# Patient Record
Sex: Female | Born: 1970
Health system: Southern US, Community
[De-identification: ages and names within clinical notes are randomized; demographics above are authoritative.]

## PROBLEM LIST (undated history)

## (undated) DIAGNOSIS — F419 Anxiety disorder, unspecified: Secondary | ICD-10-CM

## (undated) DIAGNOSIS — F32A Depression, unspecified: Secondary | ICD-10-CM

## (undated) DIAGNOSIS — R300 Dysuria: Secondary | ICD-10-CM

## (undated) DIAGNOSIS — I10 Essential (primary) hypertension: Secondary | ICD-10-CM

## (undated) DIAGNOSIS — R319 Hematuria, unspecified: Secondary | ICD-10-CM

## (undated) DIAGNOSIS — Z87442 Personal history of urinary calculi: Secondary | ICD-10-CM

## (undated) DIAGNOSIS — N201 Calculus of ureter: Secondary | ICD-10-CM

## (undated) DIAGNOSIS — G43909 Migraine, unspecified, not intractable, without status migrainosus: Secondary | ICD-10-CM

---

## 1997-10-12 ENCOUNTER — Inpatient Hospital Stay (HOSPITAL_COMMUNITY): Admission: AD | Admit: 1997-10-12 | Discharge: 1997-10-13 | Payer: Self-pay | Admitting: Obstetrics and Gynecology

## 1997-11-09 ENCOUNTER — Other Ambulatory Visit: Admission: RE | Admit: 1997-11-09 | Discharge: 1997-11-09 | Payer: Self-pay | Admitting: Obstetrics and Gynecology

## 2000-07-29 HISTORY — PX: TUBAL LIGATION: SHX77

## 2000-12-22 ENCOUNTER — Encounter: Payer: Self-pay | Admitting: Family Medicine

## 2000-12-22 ENCOUNTER — Encounter: Admission: RE | Admit: 2000-12-22 | Discharge: 2000-12-22 | Payer: Self-pay | Admitting: Family Medicine

## 2000-12-26 ENCOUNTER — Encounter: Payer: Self-pay | Admitting: Neurosurgery

## 2000-12-26 ENCOUNTER — Encounter: Admission: RE | Admit: 2000-12-26 | Discharge: 2000-12-26 | Payer: Self-pay | Admitting: Neurosurgery

## 2001-01-02 ENCOUNTER — Inpatient Hospital Stay (HOSPITAL_COMMUNITY): Admission: RE | Admit: 2001-01-02 | Discharge: 2001-01-03 | Payer: Self-pay | Admitting: Neurosurgery

## 2001-02-17 ENCOUNTER — Encounter: Admission: RE | Admit: 2001-02-17 | Discharge: 2001-05-18 | Payer: Self-pay | Admitting: Neurosurgery

## 2004-09-25 ENCOUNTER — Other Ambulatory Visit: Admission: RE | Admit: 2004-09-25 | Discharge: 2004-09-25 | Payer: Self-pay | Admitting: Obstetrics and Gynecology

## 2005-10-04 ENCOUNTER — Other Ambulatory Visit: Admission: RE | Admit: 2005-10-04 | Discharge: 2005-10-04 | Payer: Self-pay | Admitting: Obstetrics and Gynecology

## 2006-07-29 HISTORY — PX: BREAST SURGERY: SHX581

## 2006-10-21 ENCOUNTER — Other Ambulatory Visit: Admission: RE | Admit: 2006-10-21 | Discharge: 2006-10-21 | Payer: Self-pay | Admitting: Obstetrics and Gynecology

## 2008-10-03 ENCOUNTER — Emergency Department: Payer: Self-pay | Admitting: Emergency Medicine

## 2012-06-22 ENCOUNTER — Emergency Department (HOSPITAL_COMMUNITY)
Admission: EM | Admit: 2012-06-22 | Discharge: 2012-06-22 | Disposition: A | Discharge status: Home / Self Care | Payer: Managed Care, Other (non HMO) | Attending: Emergency Medicine | Admitting: Emergency Medicine

## 2012-06-22 ENCOUNTER — Encounter (HOSPITAL_COMMUNITY): Payer: Self-pay | Admitting: *Deleted

## 2012-06-22 DIAGNOSIS — Z3202 Encounter for pregnancy test, result negative: Secondary | ICD-10-CM | POA: Insufficient documentation

## 2012-06-22 DIAGNOSIS — Z87448 Personal history of other diseases of urinary system: Secondary | ICD-10-CM | POA: Insufficient documentation

## 2012-06-22 DIAGNOSIS — R109 Unspecified abdominal pain: Secondary | ICD-10-CM | POA: Insufficient documentation

## 2012-06-22 LAB — COMPREHENSIVE METABOLIC PANEL
ALT: 10 U/L (ref 0–35)
AST: 13 U/L (ref 0–37)
Albumin: 4.1 g/dL (ref 3.5–5.2)
Alkaline Phosphatase: 59 U/L (ref 39–117)
BUN: 15 mg/dL (ref 6–23)
CO2: 24 mEq/L (ref 19–32)
Calcium: 9.1 mg/dL (ref 8.4–10.5)
Chloride: 104 mEq/L (ref 96–112)
Creatinine, Ser: 0.97 mg/dL (ref 0.50–1.10)
GFR calc Af Amer: 82 mL/min — ABNORMAL LOW (ref >90)
GFR calc non Af Amer: 71 mL/min — ABNORMAL LOW (ref >90)
Glucose, Bld: 101 mg/dL — ABNORMAL HIGH (ref 70–99)
Potassium: 3.7 mEq/L (ref 3.5–5.1)
Sodium: 139 mEq/L (ref 135–145)
Total Bilirubin: 0.2 mg/dL — ABNORMAL LOW (ref 0.3–1.2)
Total Protein: 7.3 g/dL (ref 6.0–8.3)

## 2012-06-22 LAB — CBC WITH DIFFERENTIAL/PLATELET
Basophils Absolute: 0.1 10*3/uL (ref 0.0–0.1)
Basophils Relative: 1 % (ref 0–1)
Eosinophils Absolute: 0.1 10*3/uL (ref 0.0–0.7)
Eosinophils Relative: 1 % (ref 0–5)
HCT: 40.7 % (ref 36.0–46.0)
Hemoglobin: 13.8 g/dL (ref 12.0–15.0)
Lymphocytes Relative: 24 % (ref 12–46)
Lymphs Abs: 2.8 10*3/uL (ref 0.7–4.0)
MCH: 29.3 pg (ref 26.0–34.0)
MCHC: 33.9 g/dL (ref 30.0–36.0)
MCV: 86.4 fL (ref 78.0–100.0)
Monocytes Absolute: 0.9 10*3/uL (ref 0.1–1.0)
Monocytes Relative: 8 % (ref 3–12)
Neutro Abs: 7.7 10*3/uL (ref 1.7–7.7)
Neutrophils Relative %: 66 % (ref 43–77)
Platelets: 286 10*3/uL (ref 150–400)
RBC: 4.71 MIL/uL (ref 3.87–5.11)
RDW: 12.6 % (ref 11.5–15.5)
WBC: 11.6 10*3/uL — ABNORMAL HIGH (ref 4.0–10.5)

## 2012-06-22 LAB — URINE MICROSCOPIC-ADD ON

## 2012-06-22 LAB — URINALYSIS, ROUTINE W REFLEX MICROSCOPIC
Bilirubin Urine: NEGATIVE
Glucose, UA: NEGATIVE mg/dL
Ketones, ur: NEGATIVE mg/dL
Nitrite: NEGATIVE
Protein, ur: NEGATIVE mg/dL
Specific Gravity, Urine: 1.02 (ref 1.005–1.030)
Urobilinogen, UA: 0.2 mg/dL (ref 0.0–1.0)
pH: 7 (ref 5.0–8.0)

## 2012-06-22 LAB — PREGNANCY, URINE: Preg Test, Ur: NEGATIVE

## 2012-06-22 MED ORDER — ONDANSETRON 4 MG PO TBDP
4.0000 mg | ORAL_TABLET | Freq: Once | ORAL | Status: AC
  Administered 2012-06-22: 4 mg via ORAL
  Filled 2012-06-22: qty 1

## 2012-06-22 MED ORDER — OXYCODONE-ACETAMINOPHEN 5-325 MG PO TABS: 1.0000 | ORAL_TABLET | Freq: Four times a day (QID) | ORAL | Status: DC | PRN

## 2012-06-22 MED ORDER — ONDANSETRON HCL 4 MG PO TABS: 4.0000 mg | ORAL_TABLET | Freq: Four times a day (QID) | ORAL | Status: DC

## 2012-06-22 MED ORDER — TAMSULOSIN HCL 0.4 MG PO CAPS: 0.4000 mg | ORAL_CAPSULE | Freq: Every day | ORAL | Status: DC

## 2012-06-22 MED ORDER — HYDROMORPHONE HCL PF 1 MG/ML IJ SOLN
1.0000 mg | Freq: Once | INTRAMUSCULAR | Status: AC
  Administered 2012-06-22: 1 mg via INTRAMUSCULAR
  Filled 2012-06-22: qty 1

## 2012-06-22 NOTE — ED Notes (Signed)
The pt is c/o lower pain this evening.  Hx of kidney stones.  lmp now

## 2012-06-22 NOTE — ED Notes (Signed)
Pt. Reports left flank pain starting tonight. Reports pain has now moved to lower abdominal area. Hx of kidney stones, states "this feels like the last one". Pt. Took 1 vicodin prior to arrival. Pain 6/10

## 2012-06-22 NOTE — ED Provider Notes (Signed)
History     CSN: [id number]  Arrival date & time 06/22/12  2133   First MD Initiated Contact with Patient 06/22/12 2241      Chief Complaint  Patient presents with  . Abdominal Pain    (Consider location/radiation/quality/duration/timing/severity/associated sxs/prior treatment) HPI  41 year old female with history of kidney stones presents complaining of flank pain.  Patient reports acute onset of left flank pain earlier today. Pain is described as a sharp and burning sensation, waxing and waning, radiates down to her groin, nothing makes it better or worse, unable to find a comfortable position, severe intensity, and felt very similar to prior kidney stone that she has in the past. She denies fever, chills, chest pain, shortness of breath, nausea, vomiting, diarrhea, dysuria, or hematuria. She took a Vicodin that she had available which has decreased her pain however her pain has now traveled down to the lower abdomen.  She reports 2 years ago she did develop a stone in the left kidney she was able to pass. Pain is very similar to the pain she has when she had her kidney stone.  She is also on her menstruation.  She denies dysuria, vaginal discharge or rash.      Past Medical History  Diagnosis Date  . Renal disorder     History reviewed. No pertinent past surgical history.  No family history on file.  History  Substance Use Topics  . Smoking status: Never Smoker   . Smokeless tobacco: Not on file  . Alcohol Use: Yes    OB History    Grav Para Term Preterm Abortions TAB SAB Ect Mult Living                  Review of Systems  Constitutional: Negative for fever.  Genitourinary: Positive for flank pain. Negative for dysuria, vaginal discharge, vaginal pain and pelvic pain.  Skin: Negative for rash.  Neurological: Negative for numbness.    Allergies  Review of patient's allergies indicates not on file.  Home Medications  No current outpatient prescriptions on  file.  BP 130/71  Pulse 87  Temp 97.4 F (36.3 C) (Oral)  Resp 16  SpO2 98%  LMP 06/22/2012  Physical Exam  Nursing note and vitals reviewed. Constitutional: She is oriented to person, place, and time. She appears well-developed and well-nourished. She appears distressed (appears uncomfortable, leaning over bed.).  HENT:  Head: Atraumatic.  Eyes: Conjunctivae normal are normal.  Neck: Normal range of motion. Neck supple.  Cardiovascular: Normal rate and regular rhythm.  Exam reveals no gallop and no friction rub.   No murmur heard. Pulmonary/Chest: Effort normal and breath sounds normal. No respiratory distress. She exhibits no tenderness.  Abdominal: Soft. There is tenderness (minimal tenderness to palpation to left lower abd, without guarding or rebound tenderness). There is no rebound and no guarding.  Neurological: She is alert and oriented to person, place, and time.  Skin: Skin is warm. No rash noted.  Psychiatric: She has a normal mood and affect.    ED Course  Procedures (including critical care time)  Labs Reviewed  URINALYSIS, ROUTINE W REFLEX MICROSCOPIC - Abnormal; Notable for the following:    APPearance CLOUDY (*)     Hgb urine dipstick MODERATE (*)     Leukocytes, UA TRACE (*)     All other components within normal limits  CBC WITH DIFFERENTIAL - Abnormal; Notable for the following:    WBC 11.6 (*)     All other components  within normal limits  URINE MICROSCOPIC-ADD ON - Abnormal; Notable for the following:    Squamous Epithelial / LPF FEW (*)     All other components within normal limits  PREGNANCY, URINE  COMPREHENSIVE METABOLIC PANEL  URINE CULTURE   No results found.   No diagnosis found.  Results for orders placed during the hospital encounter of 06/22/12  URINALYSIS, ROUTINE W REFLEX MICROSCOPIC      Component Value Range   Color, Urine YELLOW  YELLOW   APPearance CLOUDY (*) CLEAR   Specific Gravity, Urine 1.020  1.005 - 1.030   pH 7.0  5.0  - 8.0   Glucose, UA NEGATIVE  NEGATIVE mg/dL   Hgb urine dipstick MODERATE (*) NEGATIVE   Bilirubin Urine NEGATIVE  NEGATIVE   Ketones, ur NEGATIVE  NEGATIVE mg/dL   Protein, ur NEGATIVE  NEGATIVE mg/dL   Urobilinogen, UA 0.2  0.0 - 1.0 mg/dL   Nitrite NEGATIVE  NEGATIVE   Leukocytes, UA TRACE (*) NEGATIVE  PREGNANCY, URINE      Component Value Range   Preg Test, Ur NEGATIVE  NEGATIVE  CBC WITH DIFFERENTIAL      Component Value Range   WBC 11.6 (*) 4.0 - 10.5 K/uL   RBC 4.71  3.87 - 5.11 MIL/uL   Hemoglobin 13.8  12.0 - 15.0 g/dL   HCT 16.1  09.6 - 04.5 %   MCV 86.4  78.0 - 100.0 fL   MCH 29.3  26.0 - 34.0 pg   MCHC 33.9  30.0 - 36.0 g/dL   RDW 40.9  81.1 - 91.4 %   Platelets 286  150 - 400 K/uL   Neutrophils Relative 66  43 - 77 %   Neutro Abs 7.7  1.7 - 7.7 K/uL   Lymphocytes Relative 24  12 - 46 %   Lymphs Abs 2.8  0.7 - 4.0 K/uL   Monocytes Relative 8  3 - 12 %   Monocytes Absolute 0.9  0.1 - 1.0 K/uL   Eosinophils Relative 1  0 - 5 %   Eosinophils Absolute 0.1  0.0 - 0.7 K/uL   Basophils Relative 1  0 - 1 %   Basophils Absolute 0.1  0.0 - 0.1 K/uL  COMPREHENSIVE METABOLIC PANEL      Component Value Range   Sodium 139  135 - 145 mEq/L   Potassium 3.7  3.5 - 5.1 mEq/L   Chloride 104  96 - 112 mEq/L   CO2 24  19 - 32 mEq/L   Glucose, Bld 101 (*) 70 - 99 mg/dL   BUN 15  6 - 23 mg/dL   Creatinine, Ser 7.82  0.50 - 1.10 mg/dL   Calcium 9.1  8.4 - 95.6 mg/dL   Total Protein 7.3  6.0 - 8.3 g/dL   Albumin 4.1  3.5 - 5.2 g/dL   AST 13  0 - 37 U/L   ALT 10  0 - 35 U/L   Alkaline Phosphatase 59  39 - 117 U/L   Total Bilirubin 0.2 (*) 0.3 - 1.2 mg/dL   GFR calc non Af Amer 71 (*) >90 mL/min   GFR calc Af Amer 82 (*) >90 mL/min  URINE MICROSCOPIC-ADD ON      Component Value Range   Squamous Epithelial / LPF FEW (*) RARE   WBC, UA 3-6  <3 WBC/hpf   RBC / HPF 21-50  <3 RBC/hpf   Bacteria, UA RARE  RARE   Urine-Other MUCOUS PRESENT  No results found.  1. Left flank  pain   MDM  Pt with hx of kidney stone.  Has pain suggestive of kidney stone.  Pain is acute on onset, L flank, but has travel down to L lower abdomen.  She has blood in her urine however also currently on her menstruation. I believe her pain is likely kidney stone, less likely to be ovarian torsion, UTI, or other acute abdominal pathology.  Non surgical abdomen.    Pt aware that she has retain kidney stone as she recall from prior CT scan 2 years ago in [place] [facility].  No documentation available here.  I do not think she needs CT scan, i offered, pt decline.  Plan to give pain med, and once pt more comfortable will d/c with pain meds, antinausea med and Flomax.  Pt voice understanding and agrees with plan.     11:30 PM Pt felt more comfortable and pain improves with medication.  Able to tolerates PO.  Strict return precaution given.  F/u with urologist given.  Pt voice understanding and agrees with plan.    BP 130/71  Pulse 87  Temp 97.4 F (36.3 C) (Oral)  Resp 16  SpO2 98%  LMP 06/22/2012  I have reviewed nursing notes and vital signs.  I reviewed available ER/hospitalization records thought the EMR        Fayrene Helper, [place] 06/22/12 2331

## 2012-06-22 NOTE — ED Provider Notes (Signed)
Medical screening examination/treatment/procedure(s) were performed by non-physician practitioner and as supervising physician I was immediately available for consultation/collaboration.  Flint Melter, MD 06/22/12 [phone]

## 2012-06-23 ENCOUNTER — Other Ambulatory Visit: Payer: Self-pay | Admitting: Obstetrics and Gynecology

## 2012-06-23 DIAGNOSIS — R928 Other abnormal and inconclusive findings on diagnostic imaging of breast: Secondary | ICD-10-CM

## 2012-06-25 LAB — URINE CULTURE: Colony Count: 25000

## 2012-06-30 ENCOUNTER — Ambulatory Visit
Admission: RE | Admit: 2012-06-30 | Discharge: 2012-06-30 | Disposition: A | Discharge status: Home / Self Care | Payer: Managed Care, Other (non HMO) | Source: Ambulatory Visit | Attending: Obstetrics and Gynecology | Admitting: Obstetrics and Gynecology

## 2012-06-30 DIAGNOSIS — R928 Other abnormal and inconclusive findings on diagnostic imaging of breast: Secondary | ICD-10-CM

## 2012-09-20 ENCOUNTER — Emergency Department (HOSPITAL_COMMUNITY)
Admission: EM | Admit: 2012-09-20 | Discharge: 2012-09-20 | Disposition: A | Discharge status: Home / Self Care | Payer: BC Managed Care – PPO | Attending: Emergency Medicine | Admitting: Emergency Medicine

## 2012-09-20 ENCOUNTER — Encounter (HOSPITAL_COMMUNITY): Payer: Self-pay | Admitting: *Deleted

## 2012-09-20 ENCOUNTER — Emergency Department (HOSPITAL_COMMUNITY): Payer: BC Managed Care – PPO

## 2012-09-20 DIAGNOSIS — N390 Urinary tract infection, site not specified: Secondary | ICD-10-CM | POA: Insufficient documentation

## 2012-09-20 DIAGNOSIS — Z8679 Personal history of other diseases of the circulatory system: Secondary | ICD-10-CM | POA: Insufficient documentation

## 2012-09-20 DIAGNOSIS — Z87442 Personal history of urinary calculi: Secondary | ICD-10-CM | POA: Insufficient documentation

## 2012-09-20 DIAGNOSIS — R6883 Chills (without fever): Secondary | ICD-10-CM | POA: Insufficient documentation

## 2012-09-20 DIAGNOSIS — N209 Urinary calculus, unspecified: Secondary | ICD-10-CM | POA: Insufficient documentation

## 2012-09-20 DIAGNOSIS — R300 Dysuria: Secondary | ICD-10-CM | POA: Insufficient documentation

## 2012-09-20 DIAGNOSIS — R110 Nausea: Secondary | ICD-10-CM | POA: Insufficient documentation

## 2012-09-20 DIAGNOSIS — N1330 Unspecified hydronephrosis: Secondary | ICD-10-CM | POA: Insufficient documentation

## 2012-09-20 HISTORY — DX: Migraine, unspecified, not intractable, without status migrainosus: G43.909

## 2012-09-20 LAB — URINALYSIS, ROUTINE W REFLEX MICROSCOPIC
Glucose, UA: NEGATIVE mg/dL
Specific Gravity, Urine: 1.02 (ref 1.005–1.030)
pH: 6 (ref 5.0–8.0)

## 2012-09-20 LAB — BASIC METABOLIC PANEL
CO2: 18 mEq/L — ABNORMAL LOW (ref 19–32)
Calcium: 8.6 mg/dL (ref 8.4–10.5)
Glucose, Bld: 128 mg/dL — ABNORMAL HIGH (ref 70–99)
Sodium: 133 mEq/L — ABNORMAL LOW (ref 135–145)

## 2012-09-20 LAB — CBC WITH DIFFERENTIAL/PLATELET
Eosinophils Relative: 0 % (ref 0–5)
HCT: 40.8 % (ref 36.0–46.0)
Lymphocytes Relative: 8 % — ABNORMAL LOW (ref 12–46)
Lymphs Abs: 1.3 10*3/uL (ref 0.7–4.0)
MCV: 85.9 fL (ref 78.0–100.0)
Platelets: 271 10*3/uL (ref 150–400)
RBC: 4.75 MIL/uL (ref 3.87–5.11)
WBC: 16.2 10*3/uL — ABNORMAL HIGH (ref 4.0–10.5)

## 2012-09-20 MED ORDER — TAMSULOSIN HCL 0.4 MG PO CAPS
0.4000 mg | ORAL_CAPSULE | Freq: Once | ORAL | Status: AC
  Administered 2012-09-20: 0.4 mg via ORAL
  Filled 2012-09-20: qty 1

## 2012-09-20 MED ORDER — MORPHINE SULFATE 4 MG/ML IJ SOLN
4.0000 mg | Freq: Once | INTRAMUSCULAR | Status: AC
  Administered 2012-09-20: 4 mg via INTRAVENOUS

## 2012-09-20 MED ORDER — SODIUM CHLORIDE 0.9 % IV BOLUS (SEPSIS)
1000.0000 mL | Freq: Once | INTRAVENOUS | Status: AC
  Administered 2012-09-20: 1000 mL via INTRAVENOUS

## 2012-09-20 MED ORDER — HYDROCODONE-ACETAMINOPHEN 5-325 MG PO TABS
2.0000 | ORAL_TABLET | Freq: Once | ORAL | Status: AC
  Administered 2012-09-20: 2 via ORAL
  Filled 2012-09-20: qty 2

## 2012-09-20 MED ORDER — METOCLOPRAMIDE HCL 5 MG/ML IJ SOLN
10.0000 mg | Freq: Once | INTRAMUSCULAR | Status: AC
  Administered 2012-09-20: 10 mg via INTRAVENOUS
  Filled 2012-09-20: qty 2

## 2012-09-20 MED ORDER — CEPHALEXIN 500 MG PO CAPS: 500.0000 mg | ORAL_CAPSULE | Freq: Two times a day (BID) | ORAL | Status: DC

## 2012-09-20 MED ORDER — DEXTROSE 5 % IV SOLN
1.0000 g | Freq: Once | INTRAVENOUS | Status: AC
  Administered 2012-09-20: 1 g via INTRAVENOUS
  Filled 2012-09-20: qty 10

## 2012-09-20 MED ORDER — HYDROCODONE-ACETAMINOPHEN 5-325 MG PO TABS: 1.0000 | ORAL_TABLET | Freq: Four times a day (QID) | ORAL | Status: DC | PRN

## 2012-09-20 MED ORDER — KETOROLAC TROMETHAMINE 30 MG/ML IJ SOLN
30.0000 mg | Freq: Once | INTRAMUSCULAR | Status: AC
  Administered 2012-09-20: 30 mg via INTRAVENOUS
  Filled 2012-09-20: qty 1

## 2012-09-20 MED ORDER — MORPHINE SULFATE 4 MG/ML IJ SOLN
4.0000 mg | INTRAMUSCULAR | Status: DC | PRN
  Filled 2012-09-20: qty 1

## 2012-09-20 NOTE — ED Notes (Signed)
ED Provider in room with pt 

## 2012-09-20 NOTE — ED Notes (Signed)
Pt reports left flank pain that started yesterday. Having pain with urination and nausea. Hx of kidney stones.

## 2012-09-20 NOTE — ED Provider Notes (Addendum)
History     CSN: [id number]  Arrival date & time 09/20/12  1602   First MD Initiated Contact with Patient 09/20/12 1618      Chief Complaint  Patient presents with  . Flank Pain    (Consider location/radiation/quality/duration/timing/severity/associated sxs/prior treatment) HPI Comments: 42 y/o woman comes in with cc of abd pain. Pt has hx of renal stones, on the left side - no surgery required so far. She comes in with right sided abd pain, starting in the flank region and radiating to the groin that started yday - and has worsened over time. She has assoicated nausea, no emesis .She also has some dysuria. No fevers. No new vaginal discharge, bleeding.  Patient is a 42 y.o. female presenting with flank pain. The history is provided by the patient and medical records.  Flank Pain Pertinent negatives include no chest pain, no abdominal pain, no headaches and no shortness of breath.    Past Medical History  Diagnosis Date  . Renal disorder   . Migraines     History reviewed. No pertinent past surgical history.  History reviewed. No pertinent family history.  History  Substance Use Topics  . Smoking status: Never Smoker   . Smokeless tobacco: Not on file  . Alcohol Use: Yes    OB History   Grav Para Term Preterm Abortions TAB SAB Ect Mult Living                  Review of Systems  Constitutional: Positive for chills. Negative for fever and activity change.  HENT: Negative for neck pain.   Respiratory: Negative for shortness of breath.   Cardiovascular: Negative for chest pain.  Gastrointestinal: Positive for nausea. Negative for vomiting and abdominal pain.  Genitourinary: Positive for dysuria and flank pain. Negative for frequency.  Neurological: Negative for headaches.    Allergies  Review of patient's allergies indicates no known allergies.  Home Medications   Current Outpatient Rx  Name  Route  Sig  Dispense  Refill  . ibuprofen (ADVIL) 200 MG  tablet   Oral   Take 600 mg by mouth every 6 (six) hours as needed for pain.         Marland Kitchen oxyCODONE-acetaminophen (PERCOCET/ROXICET) 5-325 MG per tablet   Oral   Take 1-2 tablets by mouth every 6 (six) hours as needed for pain.   20 tablet   0   . Tamsulosin HCl (FLOMAX) 0.4 MG CAPS   Oral   Take 1 capsule (0.4 mg total) by mouth daily.   10 capsule   0     BP 128/67  Pulse 95  Temp(Src) 97.5 F (36.4 C) (Oral)  Resp 16  SpO2 99%  Physical Exam  Nursing note and vitals reviewed. Constitutional: She is oriented to person, place, and time. She appears well-developed and well-nourished.  HENT:  Head: Normocephalic and atraumatic.  Eyes: EOM are normal. Pupils are equal, round, and reactive to light.  Neck: Neck supple.  Cardiovascular: Normal rate, regular rhythm and normal heart sounds.   No murmur heard. Pulmonary/Chest: Effort normal. No respiratory distress.  Abdominal: Soft. She exhibits no distension. There is tenderness. There is no rebound and no guarding.  LLQ tenderness with flank tenderness.  Neurological: She is alert and oriented to person, place, and time.  Skin: Skin is warm and dry.    ED Course  Procedures (including critical care time)  Labs Reviewed  CBC WITH DIFFERENTIAL - Abnormal; Notable for the following:  WBC 16.2 (*)    Neutrophils Relative 85 (*)    Neutro Abs 13.7 (*)    Lymphocytes Relative 8 (*)    Monocytes Absolute 1.2 (*)    All other components within normal limits  BASIC METABOLIC PANEL - Abnormal; Notable for the following:    Sodium 133 (*)    CO2 18 (*)    Glucose, Bld 128 (*)    GFR calc non Af Amer 62 (*)    GFR calc Af Amer 72 (*)    All other components within normal limits  URINALYSIS, ROUTINE W REFLEX MICROSCOPIC - Abnormal; Notable for the following:    APPearance TURBID (*)    Hgb urine dipstick LARGE (*)    Leukocytes, UA LARGE (*)    All other components within normal limits  URINE MICROSCOPIC-ADD ON -  Abnormal; Notable for the following:    Squamous Epithelial / LPF MANY (*)    Bacteria, UA MANY (*)    All other components within normal limits  URINE CULTURE   No results found.   No diagnosis found.    MDM  Pt comes in with cc of abd pain.  DDx includes:  Colitis Intra abdominal abscess Thrombosis Mesenteric ischemia Diverticulitis Hernia Nephrolithiasis Pyelonephritis UTI/Cystitis Ovarian cyst TOA PID  Pt comes in with cc of flank pain. Pt has hx of multiple stones in the past, none have required surgery. The current sx are more severe, and she has associated dysuria - so we will get CT pelvis to get the morphology of the stone.    Derwood Kaplan, MD 09/20/12 1733  8:58 PM Urology consulted for the large stone. They wanted to see patient in the AM at the clinic if her symptoms are under control pain wise. Pt was give 2 norco tabs, and she is feeling better. Will call Urology again, and establish an appt. She is asked to be npo after MN. No emesis.  Derwood Kaplan, MD 09/20/12 2059

## 2012-09-21 ENCOUNTER — Other Ambulatory Visit: Payer: Self-pay | Admitting: Urology

## 2012-09-21 ENCOUNTER — Encounter (HOSPITAL_COMMUNITY): Payer: Self-pay | Admitting: *Deleted

## 2012-09-21 NOTE — Pre-Procedure Instructions (Signed)
Asked to bring blue folder the day of the procedure,insurance card,I.D. driver's license,wear comfortable clothing and have a driver for the day. Asked not to take Advil,Motrin,Ibuprofen,Aleve or any NSAIDS, Aspirin, or Toradol for 72 hours prior to procedure,  No vitamins or herbal medications 7 days prior to procedure. Instructed to take laxative per doctor's office instructions and eat a light dinner the evening before procedure.   To arrive at 0530  for lithotripsy procedure. 

## 2012-09-22 ENCOUNTER — Other Ambulatory Visit: Payer: Self-pay | Admitting: Urology

## 2012-09-22 LAB — URINE CULTURE: Colony Count: 3000

## 2012-09-23 ENCOUNTER — Encounter (HOSPITAL_BASED_OUTPATIENT_CLINIC_OR_DEPARTMENT_OTHER): Payer: Self-pay | Admitting: *Deleted

## 2012-09-23 NOTE — Progress Notes (Signed)
NPO AFTER MN. ARRIVES AT 0830. NEEDS KUB AND HG. MAY TAKE RX PAIN MED IF NEEDED W/ SIPS OF WATER.

## 2012-09-24 DIAGNOSIS — N201 Calculus of ureter: Secondary | ICD-10-CM | POA: Diagnosis present

## 2012-09-24 NOTE — H&P (Signed)
Reason For Visit  Mrs. Forsee is a 42 year old female seen in followup from the emergency room with a left UPJ stone   History of Present Illness     Nephrolithiasis: She passed a 3 mm stone on the right side in 3/10. A CT scan at that time revealed a 2 mm stone in the lower pole of her left kidney. CT scan 09/20/12 - punctate left lower pole renal calculi with no right renal calculi. 8 x 11 mm left UPJ stone, Hounsfield units 1400 with some Hydro.  Interval history: She was seen in the emergency room on 09/20/12 with progressively worsening right-sided abdominal pain with radiation into the flank associated with nausea but no vomiting. Her urinalysis at that time had 7-10 RBCs, 20-50 WBCs with many bacteria but there was also many squamous cells indicating contamination. A urine culture was performed and is pending at this time. She is not having any fever, chills or other constitutional symptoms. She has been having intermittent pain. She passed a stone about 2 months ago and had some tamsulosin on hand and started taking that as well. That has run out now. The pain medication that she was prescribed is not controlling her pain adequately. Her pain is not modified by positional change. It has been associated with hematuria. She does not have dysuria.   Past Medical History Problems  1. History of  Arthritis V13.4 2. History of  Depression 311 3. History of  Migraine Headache 346.90 4. History of  Ureteral Stone Right 592.1  Surgical History Problems  1. History of  Tubal Ligation V25.2  Current Meds 1. Frova 2.5 MG Oral Tablet; Therapy: (Recorded:11Mar2010) to 2. TiZANidine HCl TABS; Therapy: (Recorded:11Mar2010) to 3. Topiramate TABS; Therapy: (Recorded:11Mar2010) to  Allergies Medication  1. No Known Drug Allergies  Family History Problems  1. Family history of  Family Health Status Number Of Children 1 son; 1 daughter  Social History Problems    Alcohol Use rare   Caffeine  Use daily   Marital History - Currently Married   Occupation: network admini.   Tobacco Use V15.82 quit smoking 2 yrs ago  Review of Systems Genitourinary, constitutional, skin, eye, otolaryngeal, hematologic/lymphatic, cardiovascular, pulmonary, endocrine, musculoskeletal, gastrointestinal, neurological and psychiatric system(s) were reviewed and pertinent findings if present are noted.  Genitourinary: hematuria.  Neurological: headache.    Vitals Vital Signs  BMI Calculated: 28.71 BSA Calculated: 1.72 Height: 5 ft 2 in Weight: 156 lb  Blood Pressure: 109 / 69 Temperature: 97.2 F Heart Rate: 96  Physical Exam Constitutional: Well nourished and well developed . No acute distress.  ENT:. The ears and nose are normal in appearance.  Neck: The appearance of the neck is normal and no neck mass is present.  Pulmonary: No respiratory distress and normal respiratory rhythm and effort.  Cardiovascular: Heart rate and rhythm are normal . No peripheral edema.  Abdomen: The abdomen is soft and nontender. No masses are palpated. No CVA tenderness. No hernias are palpable. No hepatosplenomegaly noted.  Lymphatics: The femoral and inguinal nodes are not enlarged or tender.  Skin: Normal skin turgor, no visible rash and no visible skin lesions.  Neuro/Psych:. Mood and affect are appropriate.    Results/Data Urine  COLOR YELLOW   APPEARANCE CLOUDY   SPECIFIC GRAVITY 1.030   pH 5.5   GLUCOSE NEG mg/dL  BILIRUBIN NEG   KETONE TRACE mg/dL  BLOOD SMALL   PROTEIN TRACE mg/dL  UROBILINOGEN 0.2 mg/dL  NITRITE NEG   LEUKOCYTE  ESTERASE SMALL   SQUAMOUS EPITHELIAL/HPF MANY   WBC TNTC WBC/hpf  RBC 3-6 RBC/hpf  BACTERIA MODERATE   CRYSTALS NONE SEEN   CASTS NONE SEEN    Old records or history reviewed: ER notes as above.  The following images/tracing/specimen were independently visualized:  CT scan as above.  The following clinical lab reports were reviewed:  UA as above.  The  following radiology reports were reviewed: CT scan.    Assessment Assessed  1. Proximal Ureteral Stone On The Left 592.1 2. Hydronephrosis On The Left 591 3. Nephrolithiasis Of The Left Kidney 592.0         I have discussed with the patient the fact that she has a fairly large stone located proximally in the ureter. And although it is 11 mm long it is only 4.8 mm wide. We discussed the options which would include medical expulsive therapy using an alpha-blocker, ureteroscopy and lithotripsy. I don't think a percutaneous nephrolithotomy is indicated due to the stone size. It's fairly large although it could be treated ureteroscopically this would require a postprocedural stent. We also discussed lithotripsy as another option although the stone is fairly hard based on its Hounsfield units and therefore she is at risk for incomplete fragmentation of the stone. This may necessitate further procedures if the stone does not fragment completely and passed spontaneously. We discussed the probability of success as well as the risks and palpitations. She understands and has elected to proceed with lithotripsy however because the lithotripter was not available soon enough she elected to proceed with ureteroscopy.  Plan    1. Obtain urine culture results. 2. She will be scheduled for Ureteroscopy and laser lithotripsy of her left UPJ stone. 3. I will maintain her on medical expulsive therapy and give her a prescription for pain medication.

## 2012-09-25 ENCOUNTER — Ambulatory Visit (HOSPITAL_COMMUNITY): Payer: BC Managed Care – PPO

## 2012-09-25 ENCOUNTER — Ambulatory Visit (HOSPITAL_BASED_OUTPATIENT_CLINIC_OR_DEPARTMENT_OTHER)
Admission: RE | Admit: 2012-09-25 | Discharge: 2012-09-25 | Disposition: A | Discharge status: Home / Self Care | Payer: BC Managed Care – PPO | Source: Ambulatory Visit | Attending: Urology | Admitting: Urology

## 2012-09-25 ENCOUNTER — Ambulatory Visit (HOSPITAL_BASED_OUTPATIENT_CLINIC_OR_DEPARTMENT_OTHER): Payer: BC Managed Care – PPO | Admitting: Anesthesiology

## 2012-09-25 ENCOUNTER — Encounter (HOSPITAL_BASED_OUTPATIENT_CLINIC_OR_DEPARTMENT_OTHER): Payer: Self-pay | Admitting: Anesthesiology

## 2012-09-25 ENCOUNTER — Encounter (HOSPITAL_BASED_OUTPATIENT_CLINIC_OR_DEPARTMENT_OTHER)
Admission: RE | Disposition: A | Discharge status: Home / Self Care | Payer: Self-pay | Source: Ambulatory Visit | Attending: Urology

## 2012-09-25 ENCOUNTER — Encounter (HOSPITAL_BASED_OUTPATIENT_CLINIC_OR_DEPARTMENT_OTHER): Payer: Self-pay | Admitting: *Deleted

## 2012-09-25 DIAGNOSIS — N201 Calculus of ureter: Secondary | ICD-10-CM | POA: Diagnosis present

## 2012-09-25 DIAGNOSIS — N1330 Unspecified hydronephrosis: Secondary | ICD-10-CM | POA: Insufficient documentation

## 2012-09-25 DIAGNOSIS — Z79899 Other long term (current) drug therapy: Secondary | ICD-10-CM | POA: Insufficient documentation

## 2012-09-25 DIAGNOSIS — G43909 Migraine, unspecified, not intractable, without status migrainosus: Secondary | ICD-10-CM | POA: Insufficient documentation

## 2012-09-25 HISTORY — PX: STONE EXTRACTION WITH BASKET: SHX5318

## 2012-09-25 HISTORY — PX: CYSTOSCOPY WITH RETROGRADE PYELOGRAM, URETEROSCOPY AND STENT PLACEMENT: SHX5789

## 2012-09-25 HISTORY — DX: Personal history of urinary calculi: Z87.442

## 2012-09-25 HISTORY — PX: HOLMIUM LASER APPLICATION: SHX5852

## 2012-09-25 HISTORY — DX: Calculus of ureter: N20.1

## 2012-09-25 HISTORY — DX: Dysuria: R30.0

## 2012-09-25 HISTORY — DX: Hematuria, unspecified: R31.9

## 2012-09-25 LAB — POCT HEMOGLOBIN-HEMACUE: Hemoglobin: 13.4 g/dL (ref 12.0–15.0)

## 2012-09-25 SURGERY — HOLMIUM LASER APPLICATION
Anesthesia: General | Site: Ureter | Laterality: Left | Wound class: Clean Contaminated

## 2012-09-25 MED ORDER — PROPOFOL 10 MG/ML IV BOLUS
INTRAVENOUS | Status: DC | PRN
  Administered 2012-09-25: 180 mg via INTRAVENOUS

## 2012-09-25 MED ORDER — FESOTERODINE FUMARATE ER 4 MG PO TB24
4.0000 mg | ORAL_TABLET | Freq: Every day | ORAL | Status: DC
  Administered 2012-09-25: 4 mg via ORAL
  Filled 2012-09-25: qty 1

## 2012-09-25 MED ORDER — PROMETHAZINE HCL 25 MG/ML IJ SOLN
6.2500 mg | INTRAMUSCULAR | Status: DC | PRN
  Filled 2012-09-25: qty 1

## 2012-09-25 MED ORDER — FENTANYL CITRATE 0.05 MG/ML IJ SOLN
INTRAMUSCULAR | Status: DC | PRN
  Administered 2012-09-25 (×2): 50 ug via INTRAVENOUS

## 2012-09-25 MED ORDER — DEXAMETHASONE SODIUM PHOSPHATE 4 MG/ML IJ SOLN
INTRAMUSCULAR | Status: DC | PRN
  Administered 2012-09-25: 8 mg via INTRAVENOUS

## 2012-09-25 MED ORDER — TOLTERODINE TARTRATE ER 4 MG PO CP24: 4.0000 mg | ORAL_CAPSULE | Freq: Every day | ORAL | Status: DC

## 2012-09-25 MED ORDER — FENTANYL CITRATE 0.05 MG/ML IJ SOLN
25.0000 ug | INTRAMUSCULAR | Status: DC | PRN
  Filled 2012-09-25: qty 1

## 2012-09-25 MED ORDER — MIDAZOLAM HCL 5 MG/5ML IJ SOLN
INTRAMUSCULAR | Status: DC | PRN
  Administered 2012-09-25: 2 mg via INTRAVENOUS

## 2012-09-25 MED ORDER — PHENAZOPYRIDINE HCL 200 MG PO TABS: 200.0000 mg | ORAL_TABLET | Freq: Three times a day (TID) | ORAL | Status: DC | PRN

## 2012-09-25 MED ORDER — LACTATED RINGERS IV SOLN
INTRAVENOUS | Status: DC
  Administered 2012-09-25: 11:00:00 via INTRAVENOUS
  Administered 2012-09-25: 100 mL/h via INTRAVENOUS
  Filled 2012-09-25: qty 1000

## 2012-09-25 MED ORDER — SODIUM CHLORIDE 0.9 % IR SOLN
Status: DC | PRN
  Administered 2012-09-25: 6000 mL via INTRAVESICAL

## 2012-09-25 MED ORDER — LACTATED RINGERS IV SOLN
INTRAVENOUS | Status: DC
  Filled 2012-09-25: qty 1000

## 2012-09-25 MED ORDER — MEPERIDINE HCL 25 MG/ML IJ SOLN
6.2500 mg | INTRAMUSCULAR | Status: DC | PRN
  Filled 2012-09-25: qty 1

## 2012-09-25 MED ORDER — PHENAZOPYRIDINE HCL 200 MG PO TABS
200.0000 mg | ORAL_TABLET | Freq: Once | ORAL | Status: AC
  Administered 2012-09-25: 200 mg via ORAL
  Filled 2012-09-25: qty 1

## 2012-09-25 MED ORDER — ONDANSETRON HCL 4 MG/2ML IJ SOLN
INTRAMUSCULAR | Status: DC | PRN
  Administered 2012-09-25: 4 mg via INTRAVENOUS

## 2012-09-25 MED ORDER — LIDOCAINE HCL (CARDIAC) 20 MG/ML IV SOLN
INTRAVENOUS | Status: DC | PRN
  Administered 2012-09-25: 60 mg via INTRAVENOUS

## 2012-09-25 MED ORDER — IOHEXOL 350 MG/ML SOLN
INTRAVENOUS | Status: DC | PRN
  Administered 2012-09-25: 50 mL

## 2012-09-25 MED ORDER — CIPROFLOXACIN IN D5W 200 MG/100ML IV SOLN
200.0000 mg | INTRAVENOUS | Status: AC
  Administered 2012-09-25: 200 mg via INTRAVENOUS
  Filled 2012-09-25: qty 100

## 2012-09-25 SURGICAL SUPPLY — 40 items
ADAPTER CATH URET PLST 4-6FR (CATHETERS) ×2 IMPLANT
ADPR CATH URET STRL DISP 4-6FR (CATHETERS) ×2
BAG DRAIN URO-CYSTO SKYTR STRL (DRAIN) ×3 IMPLANT
BAG DRN UROCATH (DRAIN) ×2
BASKET LASER NITINOL 1.9FR (BASKET) IMPLANT
BASKET STNLS GEMINI 4WIRE 3FR (BASKET) IMPLANT
BASKET ZERO TIP NITINOL 2.4FR (BASKET) ×2 IMPLANT
BRUSH URET BIOPSY 3F (UROLOGICAL SUPPLIES) IMPLANT
BSKT STON RTRVL 120 1.9FR (BASKET)
BSKT STON RTRVL GEM 120X11 3FR (BASKET)
BSKT STON RTRVL ZERO TP 2.4FR (BASKET) ×2
CANISTER SUCT LVC 12 LTR MEDI- (MISCELLANEOUS) ×2 IMPLANT
CATH INTERMIT  6FR 70CM (CATHETERS) IMPLANT
CATH URET 5FR 28IN CONE TIP (BALLOONS)
CATH URET 5FR 70CM CONE TIP (BALLOONS) IMPLANT
CLOTH BEACON ORANGE TIMEOUT ST (SAFETY) ×3 IMPLANT
DRAPE CAMERA CLOSED 9X96 (DRAPES) ×3 IMPLANT
ELECT REM PT RETURN 9FT ADLT (ELECTROSURGICAL)
ELECTRODE REM PT RTRN 9FT ADLT (ELECTROSURGICAL) IMPLANT
GLOVE BIO SURGEON STRL SZ 6.5 (GLOVE) ×2 IMPLANT
GLOVE BIO SURGEON STRL SZ8 (GLOVE) ×3 IMPLANT
GLOVE INDICATOR 7.0 STRL GRN (GLOVE) ×2 IMPLANT
GOWN PREVENTION PLUS LG XLONG (DISPOSABLE) ×3 IMPLANT
GOWN STRL REIN XL XLG (GOWN DISPOSABLE) ×5 IMPLANT
GUIDEWIRE 0.038 PTFE COATED (WIRE) ×1 IMPLANT
GUIDEWIRE ANG ZIPWIRE 038X150 (WIRE) IMPLANT
GUIDEWIRE STR DUAL SENSOR (WIRE) ×3 IMPLANT
IV NS IRRIG 3000ML ARTHROMATIC (IV SOLUTION) ×6 IMPLANT
KIT BALLIN UROMAX 15FX10 (LABEL) IMPLANT
KIT BALLN UROMAX 15FX4 (MISCELLANEOUS) IMPLANT
KIT BALLN UROMAX 26 75X4 (MISCELLANEOUS)
LASER FIBER DISP (UROLOGICAL SUPPLIES) ×2 IMPLANT
NS IRRIG 500ML POUR BTL (IV SOLUTION) IMPLANT
PACK CYSTOSCOPY (CUSTOM PROCEDURE TRAY) ×3 IMPLANT
SET HIGH PRES BAL DIL (LABEL)
SHEATH ACCESS URETERAL 38CM (SHEATH) ×2 IMPLANT
SHEATH ACCESS URETERAL 54CM (SHEATH) IMPLANT
SHEATH URET ACCESS 12FR/35CM (UROLOGICAL SUPPLIES) IMPLANT
SHEATH URET ACCESS 12FR/55CM (UROLOGICAL SUPPLIES) IMPLANT
WATER STERILE IRR 3000ML UROMA (IV SOLUTION) IMPLANT

## 2012-09-25 NOTE — Interval H&P Note (Signed)
History and Physical Interval Note:  09/25/2012 9:25 AM  Judith Simmons  has presented today for surgery, with the diagnosis of LEFT UPJ STONE  The various methods of treatment have been discussed with the patient and family. After consideration of risks, benefits and other options for treatment, the patient has consented to  Procedure(s): LEFT URETEROSCOPY, LASER LITHO AND STENT (Left) LEFT URETEROSCOPY, LASER LITHO AND STENT (Left) HOLMIUM LASER APPLICATION (Left) as a surgical intervention .  The patient's history has been reviewed, patient examined, no change in status, stable for surgery.  I have reviewed the patient's chart and labs.  Questions were answered to the patient's satisfaction.     Garnett Farm

## 2012-09-25 NOTE — Anesthesia Procedure Notes (Addendum)
Procedure Name: LMA Insertion Date/Time: 09/25/2012 10:22 AM Performed by: Renella Cunas D Pre-anesthesia Checklist: Patient identified, Emergency Drugs available, Suction available and Patient being monitored Patient Re-evaluated:Patient Re-evaluated prior to inductionOxygen Delivery Method: Circle System Utilized Preoxygenation: Pre-oxygenation with 100% oxygen Intubation Type: IV induction Ventilation: Mask ventilation without difficulty LMA: LMA inserted LMA Size: 4.0 Number of attempts: 1 Airway Equipment and Method: bite block Placement Confirmation: positive ETCO2 Tube secured with: Tape Dental Injury: Teeth and Oropharynx as per pre-operative assessment

## 2012-09-25 NOTE — Transfer of Care (Signed)
Immediate Anesthesia Transfer of Care Note  Patient: Judith Simmons  Procedure(s) Performed: Procedure(s) (LRB): LEFT URETEROSCOPY, LASER LITHO AND STENT (Left) HOLMIUM LASER APPLICATION (Left)  Patient Location: PACU  Anesthesia Type: General  Level of Consciousness: awake, oriented, sedated and patient cooperative  Airway & Oxygen Therapy: Patient Spontanous Breathing and Patient connected to face mask oxygen  Post-op Assessment: Report given to PACU RN and Post -op Vital signs reviewed and stable  Post vital signs: Reviewed and stable  Complications: No apparent anesthesia complications

## 2012-09-25 NOTE — Anesthesia Postprocedure Evaluation (Signed)
  Anesthesia Post-op Note  Patient: Judith Simmons  Procedure(s) Performed: Procedure(s) (LRB): LEFT URETEROSCOPY, LASER LITHO AND STENT (Left) HOLMIUM LASER APPLICATION (Left)  Patient Location: PACU  Anesthesia Type: General  Level of Consciousness: awake and alert   Airway and Oxygen Therapy: Patient Spontanous Breathing  Post-op Pain: mild  Post-op Assessment: Post-op Vital signs reviewed, Patient's Cardiovascular Status Stable, Respiratory Function Stable, Patent Airway and No signs of Nausea or vomiting  Last Vitals:  Filed Vitals:   09/25/12 1120  BP: 145/81  Pulse: 84  Temp: 36.8 C  Resp: 14    Post-op Vital Signs: stable   Complications: No apparent anesthesia complications

## 2012-09-25 NOTE — Op Note (Signed)
PATIENT:  Judith Simmons  PRE-OPERATIVE DIAGNOSIS:  left Ureteral calculus  POST-OPERATIVE DIAGNOSIS: Same  PROCEDURE:  1. cystoscopy with left retrograde pyelogram including interpretation. 2. Left ureteroscopy and laser lithotripsy. 3. Left stone extraction. 4. Left double-J stent placement  SURGEON: Garnett Farm, MD  INDICATION: Mrs. Curnutt is a 42 year old female who developed left renal colic and was found to have a 6 x 11 mm stone located just below the ureteropelvic junction on the left-hand side. She was placed on medical expulsive therapy but continued to have pain. We discussed initially performing lithotripsy but she elected to proceed with ureteroscopy as she was able to have this performed at an earlier date. The procedure, its risks and complications as well as probability of success were discussed. She understands and has elected to proceed.  ANESTHESIA:  General  EBL:  Minimal  DRAINS: 6 French, 24 cm double-J stent (with string)  SPECIMEN:  Stone  DISPOSITION OF SPECIMEN:  Given to patient  DESCRIPTION OF PROCEDURE: The patient was taken to the major OR and placed on the table. General anesthesia was administered and then the patient was moved to the dorsal lithotomy position. The genitalia was sterilely prepped and draped. An official timeout was performed.  Initially the 22 French cystoscope with 12 lens was passed under direct vision. The bladder was then entered and fully inspected. It was noted be free of any tumors stones or inflammatory lesions. Ureteral orifices were of normal configuration and position. A 6 French open-ended ureteral catheter was then passed through the cystoscope into the ureteral orifice in order to perform a left retrograde pyelogram.  A retrograde pyelogram was performed by injecting full-strength contrast up the left ureter under direct fluoroscopic control. It revealed a filling defect in the proximal ureter consistent with the stone  seen on the preoperative KUB. The remainder of the ureter was noted to be normal as was the intrarenal collecting system. I then passed a 0.038 inch floppy-tipped guidewire through the open ended catheter and into the area of the renal pelvis and this was left in place. The inner portion of a ureteral access sheath was then passed over the guidewire to gently dilate the intramural ureter. Then the ureteral access sheath with inner cannula was passed over the guidewire and in doing so I noted, under fluoroscopy, the stone had fallen back into the kidney. A ureteral access sheath was left in place and the inner portion removed as well as the guidewire. I then proceeded with ureteroscopy.  A 6 French rigid ureteroscope was then passed into the ureteral access sheath in the left ureter. The stone was identified within the middle pole calyx and I felt it was too large to extract and therefore elected to proceed with laser lithotripsy. The 200  holmium laser fiber was used to fragment the stone. I then used the nitinol basket to extract all of the stone fragments and reinspection of the intrarenal collecting system ureteroscopically revealed no further stone fragments and no injury to the ureter. I passed a guidewire back through the ureteral access sheath and removed the sheath. I then backloaded the cystoscope over the guidewire and passed the stent over the guidewire into the area of the renal pelvis. As the guidewire was removed good curl was noted in the renal pelvis. The bladder was drained and the cystoscope was then removed. The patient tolerated the procedure well no intraoperative complications.  PLAN OF CARE: Discharge to home after PACU  PATIENT DISPOSITION:  PACU - hemodynamically stable.

## 2012-09-25 NOTE — Anesthesia Preprocedure Evaluation (Signed)
Anesthesia Evaluation  Patient identified by MRN, date of birth, ID band Patient awake    Reviewed: Allergy & Precautions, H&P , NPO status , Patient's Chart, lab work & pertinent test results  Airway Mallampati: II TM Distance: >3 FB Neck ROM: Full    Dental no notable dental hx.    Pulmonary neg pulmonary ROS,  breath sounds clear to auscultation  Pulmonary exam normal       Cardiovascular negative cardio ROS  Rhythm:Regular Rate:Normal     Neuro/Psych negative neurological ROS  negative psych ROS   GI/Hepatic negative GI ROS, Neg liver ROS,   Endo/Other  negative endocrine ROS  Renal/GU negative Renal ROS  negative genitourinary   Musculoskeletal negative musculoskeletal ROS (+)   Abdominal   Peds negative pediatric ROS (+)  Hematology negative hematology ROS (+)   Anesthesia Other Findings   Reproductive/Obstetrics negative OB ROS                           Anesthesia Physical Anesthesia Plan  ASA: II  Anesthesia Plan: General   Post-op Pain Management:    Induction: Intravenous  Airway Management Planned: LMA  Additional Equipment:   Intra-op Plan:   Post-operative Plan: Extubation in OR  Informed Consent: I have reviewed the patients History and Physical, chart, labs and discussed the procedure including the risks, benefits and alternatives for the proposed anesthesia with the patient or authorized representative who has indicated his/her understanding and acceptance.   Dental advisory given  Plan Discussed with: CRNA  Anesthesia Plan Comments:         Anesthesia Quick Evaluation  

## 2012-09-28 ENCOUNTER — Ambulatory Visit (HOSPITAL_COMMUNITY): Admission: RE | Admit: 2012-09-28 | Payer: BC Managed Care – PPO | Source: Ambulatory Visit | Admitting: Urology

## 2012-09-28 ENCOUNTER — Encounter (HOSPITAL_BASED_OUTPATIENT_CLINIC_OR_DEPARTMENT_OTHER): Payer: Self-pay | Admitting: Urology

## 2012-09-28 SURGERY — LITHOTRIPSY, ESWL
Anesthesia: LOCAL | Laterality: Left

## 2013-06-08 ENCOUNTER — Emergency Department (HOSPITAL_COMMUNITY)
Admission: EM | Admit: 2013-06-08 | Discharge: 2013-06-08 | Disposition: A | Discharge status: Home / Self Care | Payer: BC Managed Care – PPO | Source: Home / Self Care | Attending: Emergency Medicine | Admitting: Emergency Medicine

## 2013-06-08 ENCOUNTER — Encounter (HOSPITAL_COMMUNITY): Payer: Self-pay | Admitting: Emergency Medicine

## 2013-06-08 DIAGNOSIS — J019 Acute sinusitis, unspecified: Secondary | ICD-10-CM

## 2013-06-08 DIAGNOSIS — J111 Influenza due to unidentified influenza virus with other respiratory manifestations: Secondary | ICD-10-CM

## 2013-06-08 LAB — POCT RAPID STREP A: Streptococcus, Group A Screen (Direct): NEGATIVE

## 2013-06-08 MED ORDER — OSELTAMIVIR PHOSPHATE 75 MG PO CAPS: 75.0000 mg | ORAL_CAPSULE | Freq: Two times a day (BID) | ORAL | Status: DC

## 2013-06-08 MED ORDER — AMOXICILLIN-POT CLAVULANATE 875-125 MG PO TABS: 1.0000 | ORAL_TABLET | Freq: Two times a day (BID) | ORAL | Status: DC

## 2013-06-08 MED ORDER — BENZONATATE 200 MG PO CAPS: 200.0000 mg | ORAL_CAPSULE | Freq: Three times a day (TID) | ORAL | Status: DC | PRN

## 2013-06-08 NOTE — ED Provider Notes (Signed)
Chief Complaint:   Chief Complaint  Patient presents with  . URI    History of Present Illness:   Judith Simmons is a 42 year old female who has had a three-day history of nasal congestion with yellow-green drainage, headache, fever of 100.3, sweats, difficulty sleeping at night, has felt hot and cold, had sore throat, hoarseness, cough productive yellow-green sputum, nausea. She denies any muscle aches, earache, wheezing, chest pain, or GI symptoms. She denies any sick exposures. Patient states she never gets flu shots.  Review of Systems:  Other than noted above, the patient denies any of the following symptoms: Systemic:  No fevers, chills, sweats, weight loss or gain, fatigue, or tiredness. Eye:  No redness or discharge. ENT:  No ear pain, drainage, headache, nasal congestion, drainage, sinus pressure, difficulty swallowing, or sore throat. Neck:  No neck pain or swollen glands. Lungs:  No cough, sputum production, hemoptysis, wheezing, chest tightness, shortness of breath or chest pain. GI:  No abdominal pain, nausea, vomiting or diarrhea.  PMFSH:  Past medical history, family history, social history, meds, and allergies were reviewed. Takes Topamax, Maxalt, and Loestrin. She has migraine headaches.  Physical Exam:   Vital signs:  BP 128/86  Pulse 102  Temp(Src) 98.6 F (37 C) (Oral)  Resp 16  SpO2 100% General:  Alert and oriented.  In no distress.  Skin warm and dry. Eye:  No conjunctival injection or drainage. Lids were normal. ENT:  TMs and canals were normal, without erythema or inflammation.  Nasal mucosa was clear and uncongested, without drainage.  Mucous membranes were moist.  Pharynx was erythematous with no exudate or drainage.  There were no oral ulcerations or lesions. Neck:  Supple, no adenopathy, tenderness or mass. Lungs:  No respiratory distress.  Lungs were clear to auscultation, without wheezes, rales or rhonchi.  Breath sounds were clear and equal bilaterally.   Heart:  Regular rhythm, without gallops, murmers or rubs. Skin:  Clear, warm, and dry, without rash or lesions.  Labs:   Results for orders placed during the hospital encounter of 06/08/13  POCT RAPID STREP A ([facility] CARE ONLY)      Result Value Range   Streptococcus, Group A Screen (Direct) NEGATIVE  NEGATIVE    Assessment:  The primary encounter diagnosis was Influenza-like illness. A diagnosis of Acute sinusitis was also pertinent to this visit.  Plan:   1.  Meds:  The following meds were prescribed:   Discharge Medication List as of 06/08/2013  9:37 AM    START taking these medications   Details  amoxicillin-clavulanate (AUGMENTIN) 875-125 MG per tablet Take 1 tablet by mouth 2 (two) times daily., Starting 06/08/2013, Until Discontinued, Normal    benzonatate (TESSALON) 200 MG capsule Take 1 capsule (200 mg total) by mouth 3 (three) times daily as needed for cough., Starting 06/08/2013, Until Discontinued, Normal    oseltamivir (TAMIFLU) 75 MG capsule Take 1 capsule (75 mg total) by mouth every 12 (twelve) hours., Starting 06/08/2013, Until Discontinued, Normal        2.  Patient Education/Counseling:  The patient was given appropriate handouts, self care instructions, and instructed in symptomatic relief.  Suggested inhalation of steam and hot compresses face. May use hot salt water gargles and throat lozenges. Also suggest over-the-counter decongestants.  3.  Follow up:  The patient was told to follow up if no better in 3 to 4 days, if becoming worse in any way, and given some red flag symptoms such as worsening  fever or difficulty breathing which would prompt immediate return.  Follow up here as necessary.      Reuben Likes, MD 06/08/13 [phone]

## 2013-06-08 NOTE — ED Notes (Signed)
Pt  Reports  Symptoms  Of  Body  Aches           Congestion  Headache  And  A  Non  Productive  Cough  That  She has  Had    Since  Yesterday       She  Is  Sitting  Upright on  Exam table  speaing in  Complete  sentances  And  Is  In no  Distress

## 2013-06-10 LAB — CULTURE, GROUP A STREP

## 2013-08-11 ENCOUNTER — Other Ambulatory Visit: Payer: Self-pay | Admitting: Obstetrics and Gynecology

## 2013-08-11 DIAGNOSIS — R928 Other abnormal and inconclusive findings on diagnostic imaging of breast: Secondary | ICD-10-CM

## 2013-08-20 ENCOUNTER — Ambulatory Visit
Admission: RE | Admit: 2013-08-20 | Discharge: 2013-08-20 | Disposition: A | Discharge status: Home / Self Care | Payer: BC Managed Care – PPO | Source: Ambulatory Visit | Attending: Obstetrics and Gynecology | Admitting: Obstetrics and Gynecology

## 2013-08-20 DIAGNOSIS — R928 Other abnormal and inconclusive findings on diagnostic imaging of breast: Secondary | ICD-10-CM

## 2013-09-22 ENCOUNTER — Encounter (HOSPITAL_COMMUNITY): Payer: Self-pay | Admitting: Emergency Medicine

## 2013-09-22 ENCOUNTER — Emergency Department (HOSPITAL_COMMUNITY)
Admission: EM | Admit: 2013-09-22 | Discharge: 2013-09-22 | Disposition: A | Discharge status: Home / Self Care | Payer: BC Managed Care – PPO | Source: Home / Self Care | Attending: Family Medicine | Admitting: Family Medicine

## 2013-09-22 DIAGNOSIS — J329 Chronic sinusitis, unspecified: Secondary | ICD-10-CM

## 2013-09-22 MED ORDER — PREDNISONE 10 MG PO TABS: 30.0000 mg | ORAL_TABLET | Freq: Every day | ORAL | Status: DC

## 2013-09-22 MED ORDER — IPRATROPIUM BROMIDE 0.06 % NA SOLN: 2.0000 | Freq: Four times a day (QID) | NASAL | Status: DC

## 2013-09-22 MED ORDER — AMOXICILLIN-POT CLAVULANATE 875-125 MG PO TABS: 1.0000 | ORAL_TABLET | Freq: Two times a day (BID) | ORAL | Status: DC

## 2013-09-22 NOTE — ED Notes (Signed)
C/o sinus problem States she has head congestion, pain neck, coughing, sneezing, yellow green bloody mucous, sinus pain  States feels as sinus is raw

## 2013-09-22 NOTE — ED Provider Notes (Signed)
Judith Simmons is a 43 y.o. female who presents to Urgent Care today for sinus congestion. Patient has had 4 days of sinus congestion pressure and purulent nasal discharge. The discharge is blood tinged. She about one month of mild rhinosinusitis symptoms however it worsened 40s ago. She denies any fever. She denies any nausea vomiting or diarrhea. She is click Claritin-D which typically worse however is not working currently. She feels well otherwise. Symptoms are moderate to severe.   Past Medical History  Diagnosis Date  . Migraines   . Ureteral calculus, left   . Hematuria   . Dysuria   . History of kidney stones    History  Substance Use Topics  . Smoking status: Never Smoker   . Smokeless tobacco: Never Used  . Alcohol Use: Yes     Comment: OCCASIONAL   ROS as above Medications: No current facility-administered medications for this encounter.   Current Outpatient Prescriptions  Medication Sig Dispense Refill  . amoxicillin-clavulanate (AUGMENTIN) 875-125 MG per tablet Take 1 tablet by mouth every 12 (twelve) hours.  14 tablet  0  . ibuprofen (ADVIL) 200 MG tablet Take 600 mg by mouth every 6 (six) hours as needed for pain.      Marland Kitchen. ipratropium (ATROVENT) 0.06 % nasal spray Place 2 sprays into both nostrils 4 (four) times daily.  15 mL  1  . predniSONE (DELTASONE) 10 MG tablet Take 3 tablets (30 mg total) by mouth daily.  15 tablet  0  . PRESCRIPTION MEDICATION Take 1 tablet by mouth daily. BIRTH CONTROL PILL      . rizatriptan (MAXALT) 10 MG tablet Take 10 mg by mouth as needed for migraine. May repeat in 2 hours if needed      . topiramate (TOPAMAX) 100 MG tablet Take 50 mg by mouth daily.        Exam:  BP 115/78  Pulse 88  Temp(Src) 98.3 F (36.8 C) (Oral)  Resp 18  SpO2 97% Gen: Well NAD HEENT: EOMI,  MMM tender palpation left maxillary sinus. Clear nasal discharge. Tympanic membranes are normal appearing bilaterally. Posterior pharynx with cobblestoning. Lungs: Normal  work of breathing. CTABL Heart: RRR no MRG Abd: NABS, Soft. NT, ND Exts: Brisk capillary refill, warm and well perfused.   No results found for this or any previous visit (from the past 24 hour(s)). No results found.  Assessment and Plan: 43 y.o. female with sinusitis. Patient has second sickening. Plan to treat with prednisone, Augmentin, and Atrovent nasal spray. Followup with primary care provider as needed  Discussed warning signs or symptoms. Please see discharge instructions. Patient expresses understanding.    Rodolph BongEvan S Samiel Peel, MD 09/22/13 [phone]

## 2013-09-22 NOTE — Discharge Instructions (Signed)
Thank you for coming in today. Use Atrovent nasal spray. Take Augmentin twice daily for one week. His prednisone daily for 5 days. Call or go to the emergency room if you get worse, have trouble breathing, have chest pains, or palpitations.   Sinusitis Sinusitis is redness, soreness, and swelling (inflammation) of the paranasal sinuses. Paranasal sinuses are air pockets within the bones of your face (beneath the eyes, the middle of the forehead, or above the eyes). In healthy paranasal sinuses, mucus is able to drain out, and air is able to circulate through them by way of your nose. However, when your paranasal sinuses are inflamed, mucus and air can become trapped. This can allow bacteria and other germs to grow and cause infection. Sinusitis can develop quickly and last only a short time (acute) or continue over a long period (chronic). Sinusitis that lasts for more than 12 weeks is considered chronic.  CAUSES  Causes of sinusitis include:  Allergies.  Structural abnormalities, such as displacement of the cartilage that separates your nostrils (deviated septum), which can decrease the air flow through your nose and sinuses and affect sinus drainage.  Functional abnormalities, such as when the small hairs (cilia) that line your sinuses and help remove mucus do not work properly or are not present. SYMPTOMS  Symptoms of acute and chronic sinusitis are the same. The primary symptoms are pain and pressure around the affected sinuses. Other symptoms include:  Upper toothache.  Earache.  Headache.  Bad breath.  Decreased sense of smell and taste.  A cough, which worsens when you are lying flat.  Fatigue.  Fever.  Thick drainage from your nose, which often is green and may contain pus (purulent).  Swelling and warmth over the affected sinuses. DIAGNOSIS  Your caregiver will perform a physical exam. During the exam, your caregiver may:  Look in your nose for signs of abnormal  growths in your nostrils (nasal polyps).  Tap over the affected sinus to check for signs of infection.  View the inside of your sinuses (endoscopy) with a special imaging device with a light attached (endoscope), which is inserted into your sinuses. If your caregiver suspects that you have chronic sinusitis, one or more of the following tests may be recommended:  Allergy tests.  Nasal culture A sample of mucus is taken from your nose and sent to a lab and screened for bacteria.  Nasal cytology A sample of mucus is taken from your nose and examined by your caregiver to determine if your sinusitis is related to an allergy. TREATMENT  Most cases of acute sinusitis are related to a viral infection and will resolve on their own within 10 days. Sometimes medicines are prescribed to help relieve symptoms (pain medicine, decongestants, nasal steroid sprays, or saline sprays).  However, for sinusitis related to a bacterial infection, your caregiver will prescribe antibiotic medicines. These are medicines that will help kill the bacteria causing the infection.  Rarely, sinusitis is caused by a fungal infection. In theses cases, your caregiver will prescribe antifungal medicine. For some cases of chronic sinusitis, surgery is needed. Generally, these are cases in which sinusitis recurs more than 3 times per year, despite other treatments. HOME CARE INSTRUCTIONS   Drink plenty of water. Water helps thin the mucus so your sinuses can drain more easily.  Use a humidifier.  Inhale steam 3 to 4 times a day (for example, sit in the bathroom with the shower running).  Apply a warm, moist washcloth to your face  3 to 4 times a day, or as directed by your caregiver.  Use saline nasal sprays to help moisten and clean your sinuses.  Take over-the-counter or prescription medicines for pain, discomfort, or fever only as directed by your caregiver. SEEK IMMEDIATE MEDICAL CARE IF:  You have increasing pain or  severe headaches.  You have nausea, vomiting, or drowsiness.  You have swelling around your face.  You have vision problems.  You have a stiff neck.  You have difficulty breathing. MAKE SURE YOU:   Understand these instructions.  Will watch your condition.  Will get help right away if you are not doing well or get worse. Document Released: 07/15/2005 Document Revised: 10/07/2011 Document Reviewed: 07/30/2011 [facility] Patient Information 2014 [place], [place].

## 2016-11-12 DIAGNOSIS — M545 Low back pain: Secondary | ICD-10-CM | POA: Diagnosis not present

## 2016-11-12 DIAGNOSIS — E669 Obesity, unspecified: Secondary | ICD-10-CM | POA: Diagnosis not present

## 2016-11-12 DIAGNOSIS — G8929 Other chronic pain: Secondary | ICD-10-CM | POA: Diagnosis not present

## 2016-11-12 DIAGNOSIS — F331 Major depressive disorder, recurrent, moderate: Secondary | ICD-10-CM | POA: Diagnosis not present

## 2016-11-22 DIAGNOSIS — J309 Allergic rhinitis, unspecified: Secondary | ICD-10-CM | POA: Diagnosis not present

## 2016-12-30 ENCOUNTER — Emergency Department (HOSPITAL_COMMUNITY): Payer: BLUE CROSS/BLUE SHIELD

## 2016-12-30 ENCOUNTER — Emergency Department (HOSPITAL_COMMUNITY)
Admission: EM | Admit: 2016-12-30 | Discharge: 2016-12-30 | Disposition: A | Discharge status: Home / Self Care | Payer: BLUE CROSS/BLUE SHIELD | Attending: Emergency Medicine | Admitting: Emergency Medicine

## 2016-12-30 ENCOUNTER — Encounter (HOSPITAL_COMMUNITY): Payer: Self-pay | Admitting: Emergency Medicine

## 2016-12-30 DIAGNOSIS — R109 Unspecified abdominal pain: Secondary | ICD-10-CM | POA: Diagnosis not present

## 2016-12-30 DIAGNOSIS — R1032 Left lower quadrant pain: Secondary | ICD-10-CM | POA: Diagnosis not present

## 2016-12-30 LAB — CBC WITH DIFFERENTIAL/PLATELET
BASOS PCT: 0 %
Basophils Absolute: 0 10*3/uL (ref 0.0–0.1)
Eosinophils Absolute: 0.3 10*3/uL (ref 0.0–0.7)
Eosinophils Relative: 3 %
HEMATOCRIT: 42.1 % (ref 36.0–46.0)
Hemoglobin: 14 g/dL (ref 12.0–15.0)
LYMPHS PCT: 31 %
Lymphs Abs: 4 10*3/uL (ref 0.7–4.0)
MCH: 29.4 pg (ref 26.0–34.0)
MCHC: 33.3 g/dL (ref 30.0–36.0)
MCV: 88.4 fL (ref 78.0–100.0)
MONO ABS: 0.8 10*3/uL (ref 0.1–1.0)
MONOS PCT: 6 %
NEUTROS ABS: 7.7 10*3/uL (ref 1.7–7.7)
NEUTROS PCT: 60 %
Platelets: 368 10*3/uL (ref 150–400)
RBC: 4.76 MIL/uL (ref 3.87–5.11)
RDW: 14 % (ref 11.5–15.5)
WBC: 12.9 10*3/uL — ABNORMAL HIGH (ref 4.0–10.5)

## 2016-12-30 LAB — URINALYSIS, ROUTINE W REFLEX MICROSCOPIC
Bilirubin Urine: NEGATIVE
GLUCOSE, UA: NEGATIVE mg/dL
Ketones, ur: NEGATIVE mg/dL
Leukocytes, UA: NEGATIVE
Nitrite: POSITIVE — AB
PH: 7 (ref 5.0–8.0)
Protein, ur: NEGATIVE mg/dL
RBC / HPF: NONE SEEN RBC/hpf (ref 0–5)
SPECIFIC GRAVITY, URINE: 1.001 — AB (ref 1.005–1.030)

## 2016-12-30 LAB — COMPREHENSIVE METABOLIC PANEL
ALBUMIN: 3.6 g/dL (ref 3.5–5.0)
ALK PHOS: 59 U/L (ref 38–126)
ALT: 12 U/L — ABNORMAL LOW (ref 14–54)
ANION GAP: 10 (ref 5–15)
AST: 20 U/L (ref 15–41)
BILIRUBIN TOTAL: 0.6 mg/dL (ref 0.3–1.2)
BUN: 13 mg/dL (ref 6–20)
CALCIUM: 8.7 mg/dL — AB (ref 8.9–10.3)
CO2: 18 mmol/L — ABNORMAL LOW (ref 22–32)
Chloride: 107 mmol/L (ref 101–111)
Creatinine, Ser: 0.83 mg/dL (ref 0.44–1.00)
GFR calc Af Amer: >60 mL/min (ref >60)
GFR calc non Af Amer: >60 mL/min (ref >60)
GLUCOSE: 103 mg/dL — AB (ref 65–99)
Potassium: 3.7 mmol/L (ref 3.5–5.1)
Sodium: 135 mmol/L (ref 135–145)
TOTAL PROTEIN: 6.4 g/dL — AB (ref 6.5–8.1)

## 2016-12-30 LAB — PREGNANCY, URINE: Preg Test, Ur: NEGATIVE

## 2016-12-30 LAB — LIPASE, BLOOD: LIPASE: 27 U/L (ref 11–51)

## 2016-12-30 LAB — POC URINE PREG, ED: PREG TEST UR: NEGATIVE

## 2016-12-30 MED ORDER — MORPHINE SULFATE (PF) 4 MG/ML IV SOLN
4.0000 mg | Freq: Once | INTRAVENOUS | Status: AC
  Administered 2016-12-30: 4 mg via INTRAVENOUS
  Filled 2016-12-30: qty 1

## 2016-12-30 MED ORDER — NAPROXEN 500 MG PO TABS: 500.0000 mg | ORAL_TABLET | Freq: Two times a day (BID) | ORAL | 0 refills | Status: DC

## 2016-12-30 MED ORDER — ONDANSETRON HCL 4 MG/2ML IJ SOLN
4.0000 mg | Freq: Once | INTRAMUSCULAR | Status: AC
  Administered 2016-12-30: 4 mg via INTRAVENOUS
  Filled 2016-12-30: qty 2

## 2016-12-30 MED ORDER — SODIUM CHLORIDE 0.9 % IV BOLUS (SEPSIS)
1000.0000 mL | Freq: Once | INTRAVENOUS | Status: AC
  Administered 2016-12-30: 1000 mL via INTRAVENOUS

## 2016-12-30 NOTE — ED Provider Notes (Signed)
[facility]-EM[facility]ENCY DEPT Provider Note   CSN: [id number] Arrival date & time: 12/30/16  4696     History   Chief Complaint Chief Complaint  Patient presents with  . Abdominal Pain    HPI Judith Simmons is a 46 y.o. female.  HPI  This 46 year old female with a history of kidney stones and migraines who presents with left flank and left lower quadrant pain. She reports onset of symptoms on Friday. However, the pain went away. It did come back on Saturday but was fairly tolerable. However, patient woke up this morning with sharp left-sided lower abdominal pain at 5 AM. Her symptoms are most consistent with her prior kidney stones. She has had some left flank pain as well. She denies any fevers or hematuria. Current pain is 8 out of 10. She took a hydrocodone at home prior to arrival. Denies any nausea, vomiting, diarrhea. Last kidney stone was approximately 3 years ago requiring lithotripsy.  Past Medical History:  Diagnosis Date  . Dysuria   . Hematuria   . History of kidney stones   . Migraines   . Ureteral calculus, left     Patient Active Problem List   Diagnosis Date Noted  . Ureteral calculus, left 09/24/2012    Past Surgical History:  Procedure Laterality Date  . BREAST SURGERY Bilateral 2008   Reduction  . CYSTOSCOPY WITH RETROGRADE PYELOGRAM, URETEROSCOPY AND STENT PLACEMENT Left 09/25/2012   Procedure: CYSTOSCOPY WITH RETROGRADE PYELOGRAM, URETEROSCOPY AND STENT PLACEMENT ;  Surgeon: Garnett Farm, MD;  Location: [facility];  Service: Urology;  Laterality: Left;  . HOLMIUM LASER APPLICATION Left 09/25/2012   Procedure: HOLMIUM LASER APPLICATION;  Surgeon: Garnett Farm, MD;  Location[facility];  Service: Urology;  Laterality: Left;  . STONE EXTRACTION WITH BASKET Left 09/25/2012   Procedure: STONE EXTRACTION WITH BASKET;  Surgeon: Garnett Farm, MD;  Location: [facility];  Service: Urology;  Laterality: Left;  . TUBAL  LIGATION  2002    OB History    No data available       Home Medications    Prior to Admission medications   Medication Sig Start Date End Date Taking? Authorizing Provider  amoxicillin-clavulanate (AUGMENTIN) 875-125 MG per tablet Take 1 tablet by mouth every 12 (twelve) hours. 09/22/13   Rodolph Bong, MD  ibuprofen (ADVIL) 200 MG tablet Take 600 mg by mouth every 6 (six) hours as needed for pain.    [provider]  ipratropium (ATROVENT) 0.06 % nasal spray Place 2 sprays into both nostrils 4 (four) times daily. 09/22/13   Rodolph Bong, MD  predniSONE (DELTASONE) 10 MG tablet Take 3 tablets (30 mg total) by mouth daily. 09/22/13   Rodolph Bong, MD  PRESCRIPTION MEDICATION Take 1 tablet by mouth daily. BIRTH CONTROL PILL    [provider]  rizatriptan (MAXALT) 10 MG tablet Take 10 mg by mouth as needed for migraine. May repeat in 2 hours if needed    [provider]  topiramate (TOPAMAX) 100 MG tablet Take 50 mg by mouth daily.    [provider]    Family History No family history on file.  Social History Social History  Substance Use Topics  . Smoking status: Never Smoker  . Smokeless tobacco: Never Used  . Alcohol use Yes     Comment: OCCASIONAL     Allergies   Patient has no known allergies.   Review of Systems Review  of Systems  Constitutional: Negative for fever.  Respiratory: Negative for shortness of breath.   Cardiovascular: Negative for chest pain.  Gastrointestinal: Positive for abdominal pain. Negative for diarrhea, nausea and vomiting.  Genitourinary: Positive for flank pain. Negative for dysuria and hematuria.  All other systems reviewed and are negative.    Physical Exam Updated Vital Signs BP (!) 144/94 (BP Location: Left Arm)   Pulse 100   Temp 97.8 F (36.6 C) (Oral)   Resp 18   Ht 5\' 2"  (1.575 m)   Wt 79.4 kg (175 lb)   SpO2 96%   BMI 32.01 kg/m   Physical Exam  Constitutional: She is oriented to  person, place, and time. No distress.  Overweight, uncomfortable appearing, no acute distress  HENT:  Head: Normocephalic and atraumatic.  Cardiovascular: Normal rate, regular rhythm and normal heart sounds.   Pulmonary/Chest: Effort normal and breath sounds normal. No respiratory distress. She has no wheezes.  Abdominal: Soft. Bowel sounds are normal. There is no tenderness. There is no guarding.  No reproducible abdominal tenderness  Genitourinary:  Genitourinary Comments: No CVA tenderness  Neurological: She is alert and oriented to person, place, and time.  Skin: Skin is warm and dry.  Psychiatric: She has a normal mood and affect.  Nursing note and vitals reviewed.    ED Treatments / Results  Labs (all labs ordered are listed, but only abnormal results are displayed) Labs Reviewed  CBC WITH DIFFERENTIAL/PLATELET - Abnormal; Notable for the following:       Result Value   WBC 12.9 (*)    All other components within normal limits  COMPREHENSIVE METABOLIC PANEL  LIPASE, BLOOD  URINALYSIS, ROUTINE W REFLEX MICROSCOPIC  PREGNANCY, URINE    EKG  EKG Interpretation None       Radiology No results found.  Procedures Procedures (including critical care time)  Medications Ordered in ED Medications  sodium chloride 0.9 % bolus 1,000 mL (1,000 mLs Intravenous New Bag/Given 12/30/16 0647)  morphine 4 MG/ML injection 4 mg (4 mg Intravenous Given 12/30/16 0648)  ondansetron (ZOFRAN) injection 4 mg (4 mg Intravenous Given 12/30/16 0646)     Initial Impression / Assessment and Plan / ED Course  I have reviewed the triage vital signs and the nursing notes.  Pertinent labs & imaging results that were available during my care of the patient were reviewed by me and considered in my medical decision making (see chart for details).     Patient presents with left flank and left lower quadrant pain. Pain is consistent with prior kidney stones. She has no reproducible pain on exam.  She is uncomfortable but nontoxic-appearing. Afebrile. Lab work obtained including urinalysis. We'll obtain a KUB and ultrasound to evaluate for kidney stones. Patient given pain and nausea medication as well as fluids.  Final Clinical Impressions(s) / ED Diagnoses   Final diagnoses:  Left flank pain    New Prescriptions New Prescriptions   No medications on file     Shon BatonHorton, Nini Cavan F, MD 12/30/16 [phone]

## 2016-12-30 NOTE — ED Notes (Signed)
Pt will be taken to u/s from radiology

## 2016-12-30 NOTE — ED Notes (Signed)
Pt in radiology at this time. 

## 2016-12-30 NOTE — ED Notes (Signed)
Patient went to bathroom and didn't get a urine aware she needs one.

## 2016-12-30 NOTE — ED Provider Notes (Signed)
Pt states her pain has resolved.  She thinks she passed a kidney stone when she urinated in the ED.  Labs reviewed with the patient.  KUB with possible ureteral stone.  US without hydronephrosis or other acute process.  Cannot confirm a kidney stone but it is certainly possible.  Doubt diverticulitis based on her resolved. Sx. Pt feels well and is ready to go home.  Discussed to return to the ED if she starts having fever, worsening symptoms.   Judith Simmons, Judith Craker, MD 12/30/16 [phone]

## 2016-12-30 NOTE — Discharge Instructions (Signed)
Return for fever, vomiting worsening symptoms as we discussed.

## 2016-12-30 NOTE — ED Triage Notes (Signed)
Pt c/o L flank pain radiating to LLQ, intermittent x 2 days, pt denies n/v/d. Pt has hx of kidney stones, denies hematuria.

## 2017-01-03 ENCOUNTER — Encounter (HOSPITAL_COMMUNITY): Payer: Self-pay | Admitting: Emergency Medicine

## 2017-01-03 ENCOUNTER — Ambulatory Visit (HOSPITAL_COMMUNITY)
Admission: EM | Admit: 2017-01-03 | Discharge: 2017-01-03 | Disposition: A | Discharge status: Home / Self Care | Payer: BLUE CROSS/BLUE SHIELD | Attending: Internal Medicine | Admitting: Internal Medicine

## 2017-01-03 DIAGNOSIS — N200 Calculus of kidney: Secondary | ICD-10-CM

## 2017-01-03 DIAGNOSIS — R102 Pelvic and perineal pain: Secondary | ICD-10-CM | POA: Insufficient documentation

## 2017-01-03 DIAGNOSIS — M549 Dorsalgia, unspecified: Secondary | ICD-10-CM | POA: Diagnosis present

## 2017-01-03 DIAGNOSIS — Z3202 Encounter for pregnancy test, result negative: Secondary | ICD-10-CM

## 2017-01-03 DIAGNOSIS — Z87442 Personal history of urinary calculi: Secondary | ICD-10-CM | POA: Insufficient documentation

## 2017-01-03 DIAGNOSIS — N390 Urinary tract infection, site not specified: Secondary | ICD-10-CM

## 2017-01-03 DIAGNOSIS — N939 Abnormal uterine and vaginal bleeding, unspecified: Secondary | ICD-10-CM | POA: Diagnosis not present

## 2017-01-03 DIAGNOSIS — G43909 Migraine, unspecified, not intractable, without status migrainosus: Secondary | ICD-10-CM | POA: Diagnosis not present

## 2017-01-03 DIAGNOSIS — R109 Unspecified abdominal pain: Secondary | ICD-10-CM | POA: Diagnosis present

## 2017-01-03 LAB — POCT URINALYSIS DIP (DEVICE)
Bilirubin Urine: NEGATIVE
Glucose, UA: NEGATIVE mg/dL
KETONES UR: NEGATIVE mg/dL
Nitrite: NEGATIVE
PH: 6.5 (ref 5.0–8.0)
PROTEIN: NEGATIVE mg/dL
Specific Gravity, Urine: 1.025 (ref 1.005–1.030)
Urobilinogen, UA: 0.2 mg/dL (ref 0.0–1.0)

## 2017-01-03 LAB — POCT PREGNANCY, URINE: Preg Test, Ur: NEGATIVE

## 2017-01-03 MED ORDER — HYDROCODONE-ACETAMINOPHEN 5-325 MG PO TABS: 2.0000 | ORAL_TABLET | ORAL | 0 refills | Status: DC | PRN

## 2017-01-03 MED ORDER — TAMSULOSIN HCL 0.4 MG PO CAPS: 0.4000 mg | ORAL_CAPSULE | Freq: Every day | ORAL | 0 refills | Status: DC

## 2017-01-03 MED ORDER — CEPHALEXIN 500 MG PO CAPS: 500.0000 mg | ORAL_CAPSULE | Freq: Four times a day (QID) | ORAL | 0 refills | Status: DC

## 2017-01-03 NOTE — ED Triage Notes (Signed)
[facility] a [facility] abdominal pain and lower right back pain that started last night.

## 2017-01-03 NOTE — ED Provider Notes (Signed)
CSN: [id number]     Arrival date & time 01/03/17  1133 History   None    Chief Complaint  Patient presents with  . Abdominal Pain  . Back Pain   (Consider location/radiation/quality/duration/timing/severity/associated sxs/prior Treatment) Patient c/o Right lower quadrant pain, back pain, and some nausea.  She is not having nausea at present.     The history is provided by the patient.  Abdominal Pain  Pain location:  RLQ Pain quality: aching   Pain radiates to:  Does not radiate Pain severity:  Moderate Duration:  1 day Timing:  Constant Progression:  Worsening Chronicity:  New Relieved by:  Nothing Worsened by:  Nothing Ineffective treatments:  None tried Back Pain  Associated symptoms: abdominal pain     Past Medical History:  Diagnosis Date  . Dysuria   . Hematuria   . History of kidney stones   . Migraines   . Ureteral calculus, left    Past Surgical History:  Procedure Laterality Date  . BREAST SURGERY Bilateral 2008   Reduction  . CYSTOSCOPY WITH RETROGRADE PYELOGRAM, URETEROSCOPY AND STENT PLACEMENT Left 09/25/2012   Procedure: CYSTOSCOPY WITH RETROGRADE PYELOGRAM, URETEROSCOPY AND STENT PLACEMENT ;  Surgeon: Garnett Farm, MD;  Location: [facility];  Service: Urology;  Laterality: Left;  . HOLMIUM LASER APPLICATION Left 09/25/2012   Procedure: HOLMIUM LASER APPLICATION;  Surgeon: Garnett Farm, MD;  Location: [facility];  Service: Urology;  Laterality: Left;  . STONE EXTRACTION WITH BASKET Left 09/25/2012   Procedure: STONE EXTRACTION WITH BASKET;  Surgeon: Garnett Farm, MD;  Location: [facility]  Service: Urology;  Laterality: Left;  . TUBAL LIGATION  2002   History reviewed. No pertinent family history. Social History  Substance Use Topics  . Smoking status: Never Smoker  . Smokeless tobacco: Never Used  . Alcohol use Yes     Comment: OCCASIONAL   OB History    No data available     Review of  Systems  Constitutional: Negative.   HENT: Negative.   Eyes: Negative.   Respiratory: Negative.   Cardiovascular: Negative.   Gastrointestinal: Positive for abdominal pain.  Endocrine: Negative.   Genitourinary: Negative.   Musculoskeletal: Positive for back pain.  Allergic/Immunologic: Negative.   Neurological: Negative.   Hematological: Negative.   Psychiatric/Behavioral: Negative.     Allergies  Patient has no known allergies.  Home Medications   Prior to Admission medications   Medication Sig Start Date End Date Taking? Authorizing Provider  PRESCRIPTION MEDICATION Take 1 tablet by mouth daily. BIRTH CONTROL PILL   Yes [provider]  rizatriptan (MAXALT) 10 MG tablet Take 10 mg by mouth as needed for migraine. May repeat in 2 hours if needed   Yes [provider]  topiramate (TOPAMAX) 100 MG tablet Take 50 mg by mouth daily.   Yes [provider]  cephALEXin (KEFLEX) 500 MG capsule Take 1 capsule (500 mg total) by mouth 4 (four) times daily. 01/03/17   Deatra Canter, FNP  HYDROcodone-acetaminophen (NORCO/VICODIN) 5-325 MG tablet Take 2 tablets by mouth every 4 (four) hours as needed. 01/03/17   Deatra Canter, FNP  tamsulosin (FLOMAX) 0.4 MG CAPS capsule Take 1 capsule (0.4 mg total) by mouth daily. 01/03/17   Deatra Canter, FNP   Meds Ordered and Administered this Visit  Medications - No data to display  BP (!) 147/81 (BP Location: Right Arm)   Pulse 99   Temp 98.8  F (37.1 C) (Oral)   Resp 20   SpO2 98%  No data found.   Physical Exam  Constitutional: She is oriented to person, place, and time. She appears well-developed and well-nourished.  HENT:  Head: Normocephalic and atraumatic.  Eyes: Conjunctivae and EOM are normal. Pupils are equal, round, and reactive to light.  Neck: Normal range of motion. Neck supple.  Cardiovascular: Normal rate, regular rhythm and normal heart sounds.   Pulmonary/Chest: Effort normal and breath  sounds normal.  Abdominal: There is tenderness.  Pain in right pelvis and RLQ of abdomen no guarding.  Genitourinary:  Genitourinary Comments: Mild CVA tenderness right side.  Neurological: She is alert and oriented to person, place, and time.  Nursing note and vitals reviewed.   Urgent Care Course     Procedures (including critical care time)  Labs Review Labs Reviewed  POCT URINALYSIS DIP (DEVICE) - Abnormal; Notable for the following:       Result Value   Hgb urine dipstick MODERATE (*)    Leukocytes, UA SMALL (*)    All other components within normal limits  URINE CULTURE  POCT PREGNANCY, URINE    Imaging Review No results found.   Visual Acuity Review  Right Eye Distance:   Left Eye Distance:   Bilateral Distance:    Right Eye Near:   Left Eye Near:    Bilateral Near:         MDM   1. Pelvic pain in female   2. Kidney stone   3. Lower urinary tract infectious disease    UA cx Keflex 500mg  one po qid x 5 days #20 Norco 5/325 one po q 6 hours prn #6 Flomax 0.4mg  one po qd #7  Push po fluids, rest, tylenol and motrin otc prn as directed for fever, arthralgias, and myalgias.  Follow up prn if sx's continue or persist.    Deatra CanterOxford, Rockwell Zentz J, FNP 01/03/17 1244

## 2017-01-04 LAB — URINE CULTURE: Culture: <10000 — AB

## 2017-01-23 DIAGNOSIS — Z6833 Body mass index (BMI) 33.0-33.9, adult: Secondary | ICD-10-CM | POA: Diagnosis not present

## 2017-01-23 DIAGNOSIS — Z01419 Encounter for gynecological examination (general) (routine) without abnormal findings: Secondary | ICD-10-CM | POA: Diagnosis not present

## 2017-01-23 DIAGNOSIS — Z1231 Encounter for screening mammogram for malignant neoplasm of breast: Secondary | ICD-10-CM | POA: Diagnosis not present

## 2017-03-04 DIAGNOSIS — G43831 Menstrual migraine, intractable, with status migrainosus: Secondary | ICD-10-CM | POA: Diagnosis not present

## 2017-03-04 DIAGNOSIS — G43019 Migraine without aura, intractable, without status migrainosus: Secondary | ICD-10-CM | POA: Diagnosis not present

## 2017-04-07 DIAGNOSIS — G4723 Circadian rhythm sleep disorder, irregular sleep wake type: Secondary | ICD-10-CM | POA: Diagnosis not present

## 2017-04-07 DIAGNOSIS — G43719 Chronic migraine without aura, intractable, without status migrainosus: Secondary | ICD-10-CM | POA: Diagnosis not present

## 2017-05-12 DIAGNOSIS — G43719 Chronic migraine without aura, intractable, without status migrainosus: Secondary | ICD-10-CM | POA: Diagnosis not present

## 2017-05-12 DIAGNOSIS — G4723 Circadian rhythm sleep disorder, irregular sleep wake type: Secondary | ICD-10-CM | POA: Diagnosis not present

## 2017-05-27 DIAGNOSIS — E669 Obesity, unspecified: Secondary | ICD-10-CM | POA: Diagnosis not present

## 2017-05-27 DIAGNOSIS — F331 Major depressive disorder, recurrent, moderate: Secondary | ICD-10-CM | POA: Diagnosis not present

## 2017-05-27 DIAGNOSIS — M545 Low back pain: Secondary | ICD-10-CM | POA: Diagnosis not present

## 2017-05-27 DIAGNOSIS — M2550 Pain in unspecified joint: Secondary | ICD-10-CM | POA: Diagnosis not present

## 2017-10-09 DIAGNOSIS — R52 Pain, unspecified: Secondary | ICD-10-CM | POA: Diagnosis not present

## 2017-10-09 DIAGNOSIS — G4723 Circadian rhythm sleep disorder, irregular sleep wake type: Secondary | ICD-10-CM | POA: Diagnosis not present

## 2017-10-09 DIAGNOSIS — G43719 Chronic migraine without aura, intractable, without status migrainosus: Secondary | ICD-10-CM | POA: Diagnosis not present

## 2018-04-15 DIAGNOSIS — J029 Acute pharyngitis, unspecified: Secondary | ICD-10-CM | POA: Diagnosis not present

## 2018-04-15 DIAGNOSIS — H6691 Otitis media, unspecified, right ear: Secondary | ICD-10-CM | POA: Diagnosis not present

## 2018-05-27 DIAGNOSIS — G43909 Migraine, unspecified, not intractable, without status migrainosus: Secondary | ICD-10-CM | POA: Diagnosis not present

## 2018-05-27 DIAGNOSIS — R52 Pain, unspecified: Secondary | ICD-10-CM | POA: Diagnosis not present

## 2018-08-03 DIAGNOSIS — R202 Paresthesia of skin: Secondary | ICD-10-CM | POA: Diagnosis not present

## 2018-08-03 DIAGNOSIS — F331 Major depressive disorder, recurrent, moderate: Secondary | ICD-10-CM | POA: Diagnosis not present

## 2018-08-03 DIAGNOSIS — R0981 Nasal congestion: Secondary | ICD-10-CM | POA: Diagnosis not present

## 2018-08-21 DIAGNOSIS — K1370 Unspecified lesions of oral mucosa: Secondary | ICD-10-CM | POA: Diagnosis not present

## 2018-08-21 DIAGNOSIS — J069 Acute upper respiratory infection, unspecified: Secondary | ICD-10-CM | POA: Diagnosis not present

## 2018-09-03 ENCOUNTER — Encounter: Payer: BLUE CROSS/BLUE SHIELD | Admitting: Diagnostic Neuroimaging

## 2018-09-07 DIAGNOSIS — Z01419 Encounter for gynecological examination (general) (routine) without abnormal findings: Secondary | ICD-10-CM | POA: Diagnosis not present

## 2018-09-07 DIAGNOSIS — Z6835 Body mass index (BMI) 35.0-35.9, adult: Secondary | ICD-10-CM | POA: Diagnosis not present

## 2018-09-07 DIAGNOSIS — Z1389 Encounter for screening for other disorder: Secondary | ICD-10-CM | POA: Diagnosis not present

## 2018-09-07 DIAGNOSIS — Z1231 Encounter for screening mammogram for malignant neoplasm of breast: Secondary | ICD-10-CM | POA: Diagnosis not present

## 2018-10-01 ENCOUNTER — Ambulatory Visit (INDEPENDENT_AMBULATORY_CARE_PROVIDER_SITE_OTHER): Payer: BLUE CROSS/BLUE SHIELD | Admitting: Diagnostic Neuroimaging

## 2018-10-01 ENCOUNTER — Encounter: Payer: BLUE CROSS/BLUE SHIELD | Admitting: Diagnostic Neuroimaging

## 2018-10-01 DIAGNOSIS — R200 Anesthesia of skin: Secondary | ICD-10-CM | POA: Diagnosis not present

## 2018-10-01 DIAGNOSIS — Z0289 Encounter for other administrative examinations: Secondary | ICD-10-CM

## 2018-10-08 NOTE — Procedures (Signed)
[facility]  NCS (NERVE CONDUCTION STUDY) WITH EMG (ELECTROMYOGRAPHY) REPORT   STUDY DATE: 10/01/18 PATIENT NAME: Judith Simmons DOB: 01/02/1971 MRN: [id number]  ORDERING CLINICIAN: Tally Joe  TECHNOLOGIST: Durenda Age ELECTROMYOGRAPHER: Glenford Bayley. Mackenzy Grumbine, MD  CLINICAL INFORMATION: 48 year old female with bilateral hand numbness.  FINDINGS: NERVE CONDUCTION STUDY:  Bilateral median and ulnar motor responses are normal.  Bilateral ulnar F-wave latencies are normal.  Bilateral median to ulnar transcarpal mixed nerve comparison studies show prolonged peak latency differences (right 0.6 ms, left 1.3 ms, normal less than or equal to 0.4 ms).  Bilateral median sensory responses have prolonged peak latencies and decreased amplitudes.  Bilateral ulnar sensory responses are normal.   NEEDLE ELECTROMYOGRAPHY:  Needle examination of left upper extremity deltoid, biceps, triceps, flexor carpi radialis, first dorsal interosseous is normal.   IMPRESSION:   Abnormal study demonstrating: - Bilateral mild median neuropathies at the wrist consistent with bilateral mild carpal tunnel syndrome.    INTERPRETING PHYSICIAN:  Suanne Marker, MD Certified in Neurology, Neurophysiology and Neuroimaging  [facility] [address] [place], [place] [address] [phone]   [facility]    Nerve / Sites Muscle Latency Ref. Amplitude Ref. Rel Amp Segments Distance Velocity Ref. Area    ms ms mV mV %  cm m/s m/s mVms  R Median - APB     Wrist APB 3.7 ?4.4 5.0 ?4.0 100 Wrist - APB 7   17.5     Upper arm APB 7.1  4.1  82.6 Upper arm - Wrist 19 55 ?49 14.0  L Median - APB     Wrist APB 4.2 ?4.4 6.6 ?4.0 100 Wrist - APB 7   21.4     Upper arm APB 7.5  6.3  94.7 Upper arm - Wrist 18 54 ?49 20.3  R Ulnar - ADM     Wrist ADM 2.2 ?3.3 12.0 ?6.0 100 Wrist - ADM 7   34.6     B.Elbow ADM 4.5  11.8  97.8 B.Elbow - Wrist 17 74 ?49 33.7     A.Elbow ADM 6.0   11.4  97 A.Elbow - B.Elbow 10 66 ?49 32.0         A.Elbow - Wrist      L Ulnar - ADM     Wrist ADM 2.1 ?3.3 13.3 ?6.0 100 Wrist - ADM 7   33.6     B.Elbow ADM 4.8  12.7  95.1 B.Elbow - Wrist 17 64 ?49 32.9     A.Elbow ADM 6.4  12.1  95.3 A.Elbow - B.Elbow 10 64 ?49 31.8         A.Elbow - Wrist                 SNC    Nerve / Sites Rec. Site Peak Lat Ref.  Amp Ref. Segments Distance Peak Diff Ref.    ms ms V V  cm ms ms  R Median, Ulnar - Transcarpal comparison     Median Palm Wrist 2.4 ?2.2 42 ?35 Median Palm - Wrist 8       Ulnar Palm Wrist 1.8 ?2.2 25 ?12 Ulnar Palm - Wrist 8          Median Palm - Ulnar Palm  0.6 ?0.4  L Median, Ulnar - Transcarpal comparison     Median Palm Wrist 3.1 ?2.2 28 ?35 Median Palm - Wrist 8       Ulnar Palm Wrist 1.8 ?2.2  29 ?12 Ulnar Palm - Wrist 8          Median Palm - Ulnar Palm  1.3 ?0.4  R Median - Orthodromic (Dig II, Mid palm)     Dig II Wrist 3.5 ?3.4 8 ?10 Dig II - Wrist 13    L Median - Orthodromic (Dig II, Mid palm)     Dig II Wrist 4.5 ?3.4 4 ?10 Dig II - Wrist 13    R Ulnar - Orthodromic, (Dig V, Mid palm)     Dig V Wrist 2.6 ?3.1 6 ?5 Dig V - Wrist 11    L Ulnar - Orthodromic, (Dig V, Mid palm)     Dig V Wrist 2.7 ?3.1 5 ?5 Dig V - Wrist 70                   F  Wave    Nerve F Lat Ref.   ms ms  R Ulnar - ADM 24.3 ?32.0  L Ulnar - ADM 23.3 ?32.0         EMG full       EMG Summary Table    Spontaneous MUAP Recruitment  Muscle IA Fib PSW Fasc Other Amp Dur. Poly Pattern  L. Deltoid Normal None None None _______ Normal Normal Normal Normal  L. Biceps brachii Normal None None None _______ Normal Normal Normal Normal  L. Triceps brachii Normal None None None _______ Normal Normal Normal Normal  L. Flexor carpi radialis Normal None None None _______ Normal Normal Normal Normal  L. First dorsal interosseous Normal None None None _______ Normal Normal Normal Normal

## 2019-02-01 DIAGNOSIS — G5603 Carpal tunnel syndrome, bilateral upper limbs: Secondary | ICD-10-CM | POA: Diagnosis not present

## 2019-02-01 DIAGNOSIS — J309 Allergic rhinitis, unspecified: Secondary | ICD-10-CM | POA: Diagnosis not present

## 2019-02-01 DIAGNOSIS — F331 Major depressive disorder, recurrent, moderate: Secondary | ICD-10-CM | POA: Diagnosis not present

## 2019-02-01 DIAGNOSIS — M545 Low back pain: Secondary | ICD-10-CM | POA: Diagnosis not present

## 2019-07-16 DIAGNOSIS — R438 Other disturbances of smell and taste: Secondary | ICD-10-CM | POA: Diagnosis not present

## 2019-07-16 DIAGNOSIS — Z20828 Contact with and (suspected) exposure to other viral communicable diseases: Secondary | ICD-10-CM | POA: Diagnosis not present

## 2019-07-16 DIAGNOSIS — R05 Cough: Secondary | ICD-10-CM | POA: Diagnosis not present

## 2019-07-16 DIAGNOSIS — R430 Anosmia: Secondary | ICD-10-CM | POA: Diagnosis not present

## 2019-10-26 DIAGNOSIS — F331 Major depressive disorder, recurrent, moderate: Secondary | ICD-10-CM | POA: Diagnosis not present

## 2019-10-26 DIAGNOSIS — J309 Allergic rhinitis, unspecified: Secondary | ICD-10-CM | POA: Diagnosis not present

## 2019-10-26 DIAGNOSIS — E669 Obesity, unspecified: Secondary | ICD-10-CM | POA: Diagnosis not present

## 2019-10-26 DIAGNOSIS — M545 Low back pain: Secondary | ICD-10-CM | POA: Diagnosis not present

## 2020-01-10 DIAGNOSIS — Z01419 Encounter for gynecological examination (general) (routine) without abnormal findings: Secondary | ICD-10-CM | POA: Diagnosis not present

## 2020-01-10 DIAGNOSIS — Z6836 Body mass index (BMI) 36.0-36.9, adult: Secondary | ICD-10-CM | POA: Diagnosis not present

## 2020-01-10 DIAGNOSIS — Z79890 Hormone replacement therapy (postmenopausal): Secondary | ICD-10-CM | POA: Diagnosis not present

## 2020-01-10 DIAGNOSIS — Z1231 Encounter for screening mammogram for malignant neoplasm of breast: Secondary | ICD-10-CM | POA: Diagnosis not present

## 2020-01-17 ENCOUNTER — Other Ambulatory Visit: Payer: Self-pay | Admitting: Obstetrics and Gynecology

## 2020-01-17 DIAGNOSIS — R928 Other abnormal and inconclusive findings on diagnostic imaging of breast: Secondary | ICD-10-CM

## 2020-01-27 ENCOUNTER — Ambulatory Visit
Admission: RE | Admit: 2020-01-27 | Discharge: 2020-01-27 | Disposition: A | Discharge status: Home / Self Care | Payer: BLUE CROSS/BLUE SHIELD | Source: Ambulatory Visit | Attending: Obstetrics and Gynecology | Admitting: Obstetrics and Gynecology

## 2020-01-27 ENCOUNTER — Other Ambulatory Visit: Payer: Self-pay

## 2020-01-27 ENCOUNTER — Other Ambulatory Visit: Payer: Self-pay | Admitting: Obstetrics and Gynecology

## 2020-01-27 DIAGNOSIS — R928 Other abnormal and inconclusive findings on diagnostic imaging of breast: Secondary | ICD-10-CM

## 2020-01-27 DIAGNOSIS — N6310 Unspecified lump in the right breast, unspecified quadrant: Secondary | ICD-10-CM

## 2020-01-27 DIAGNOSIS — N6489 Other specified disorders of breast: Secondary | ICD-10-CM | POA: Diagnosis not present

## 2020-02-07 ENCOUNTER — Other Ambulatory Visit: Payer: Self-pay

## 2020-02-07 ENCOUNTER — Ambulatory Visit
Admission: RE | Admit: 2020-02-07 | Discharge: 2020-02-07 | Disposition: A | Discharge status: Home / Self Care | Payer: BC Managed Care – PPO | Source: Ambulatory Visit | Attending: Obstetrics and Gynecology | Admitting: Obstetrics and Gynecology

## 2020-02-07 ENCOUNTER — Other Ambulatory Visit: Payer: Self-pay | Admitting: Obstetrics and Gynecology

## 2020-02-07 DIAGNOSIS — N6310 Unspecified lump in the right breast, unspecified quadrant: Secondary | ICD-10-CM

## 2020-02-07 DIAGNOSIS — R928 Other abnormal and inconclusive findings on diagnostic imaging of breast: Secondary | ICD-10-CM | POA: Diagnosis not present

## 2020-02-07 DIAGNOSIS — N6011 Diffuse cystic mastopathy of right breast: Secondary | ICD-10-CM | POA: Diagnosis not present

## 2020-03-21 DIAGNOSIS — E669 Obesity, unspecified: Secondary | ICD-10-CM | POA: Diagnosis not present

## 2020-03-21 DIAGNOSIS — J309 Allergic rhinitis, unspecified: Secondary | ICD-10-CM | POA: Diagnosis not present

## 2020-03-21 DIAGNOSIS — M545 Low back pain: Secondary | ICD-10-CM | POA: Diagnosis not present

## 2020-03-21 DIAGNOSIS — R413 Other amnesia: Secondary | ICD-10-CM | POA: Diagnosis not present

## 2020-03-21 DIAGNOSIS — F331 Major depressive disorder, recurrent, moderate: Secondary | ICD-10-CM | POA: Diagnosis not present

## 2020-08-17 DIAGNOSIS — J019 Acute sinusitis, unspecified: Secondary | ICD-10-CM | POA: Diagnosis not present

## 2020-08-17 DIAGNOSIS — R059 Cough, unspecified: Secondary | ICD-10-CM | POA: Diagnosis not present

## 2020-08-17 DIAGNOSIS — U071 COVID-19: Secondary | ICD-10-CM | POA: Diagnosis not present

## 2020-10-10 DIAGNOSIS — G43909 Migraine, unspecified, not intractable, without status migrainosus: Secondary | ICD-10-CM | POA: Diagnosis not present

## 2020-10-20 IMAGING — MG MM BREAST LOCALIZATION CLIP
4 series · 4 of 12 positions shown · non-contrast
Comparison: Previous exam(s).

CLINICAL DATA: Evaluate post biopsy marker clip placement following
stereotactic core needle biopsy of a right breast focal asymmetry.

EXAM:
DIAGNOSTIC RIGHT MAMMOGRAM POST STEREOTACTIC BIOPSY

[R CC synth-2D]
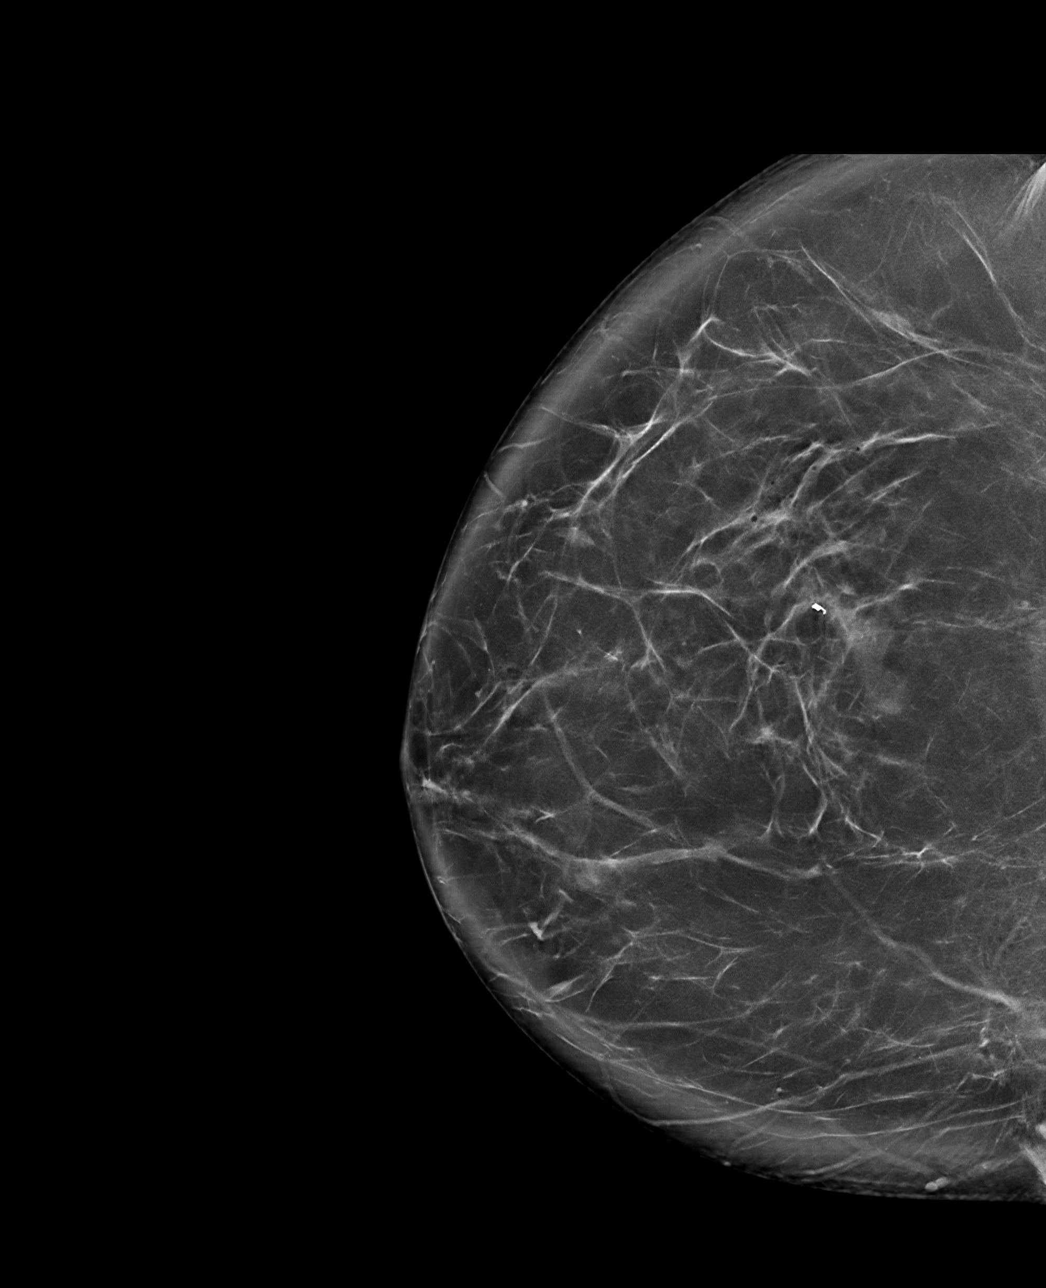

[R LM synth-2D]
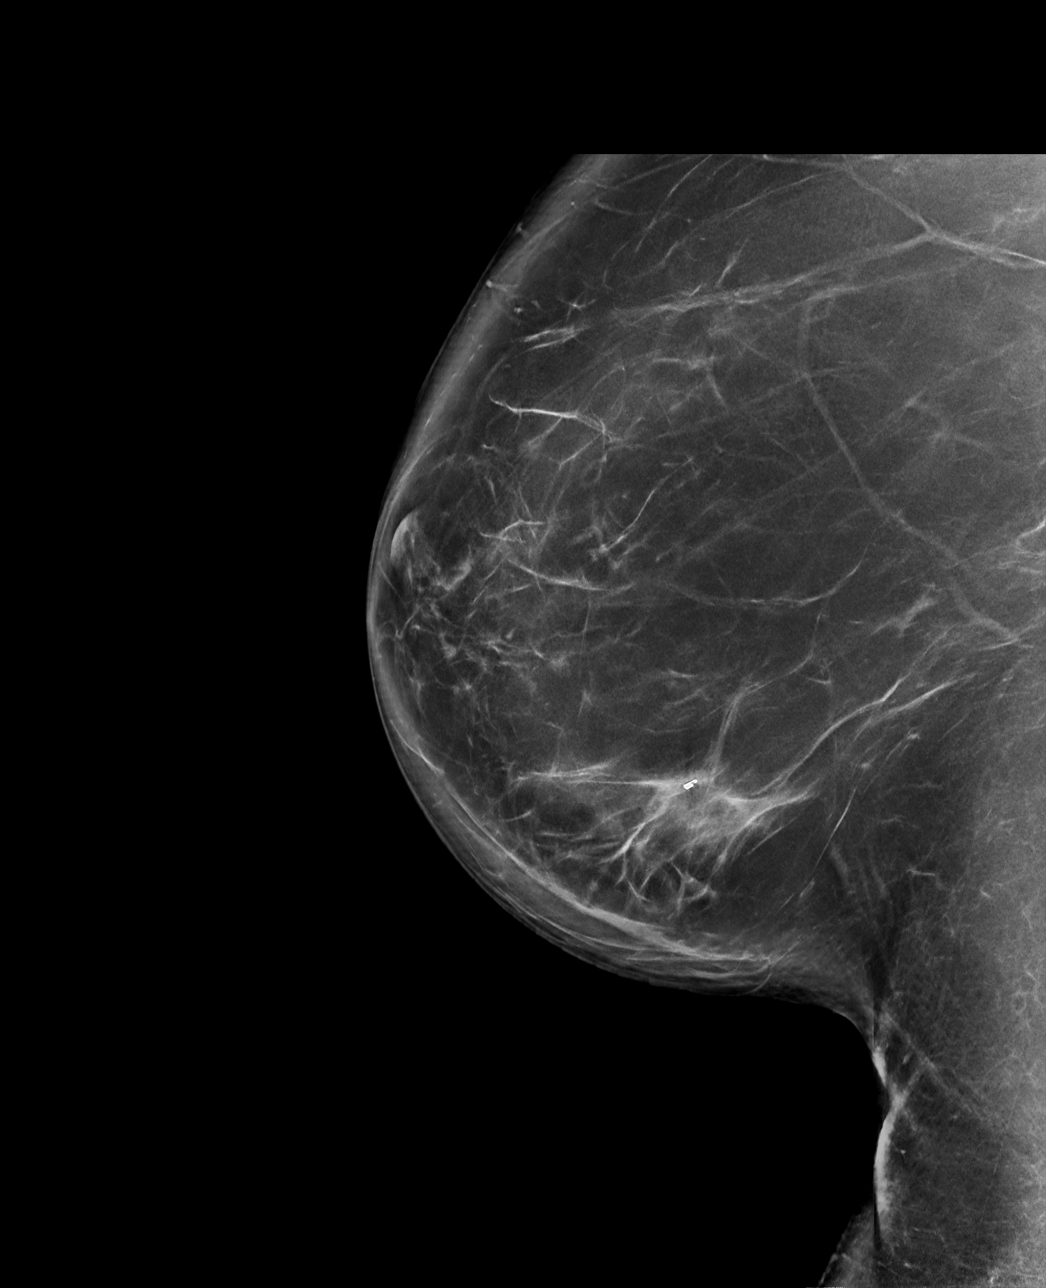

[R LM tomo · tomo slice 62/123.0]
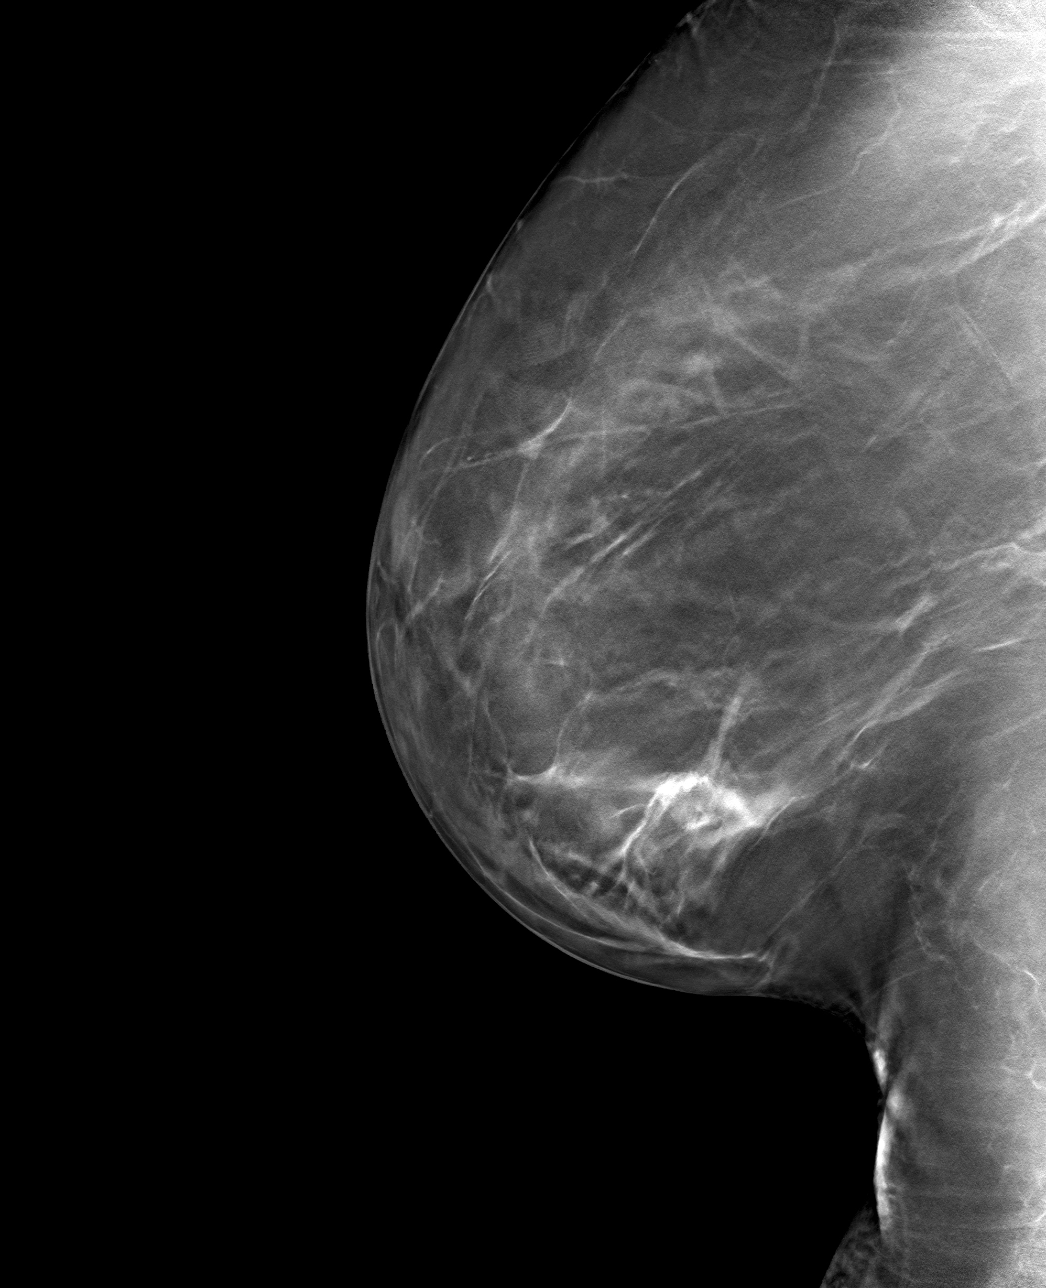

[R CC tomo · tomo slice 53/104.0]
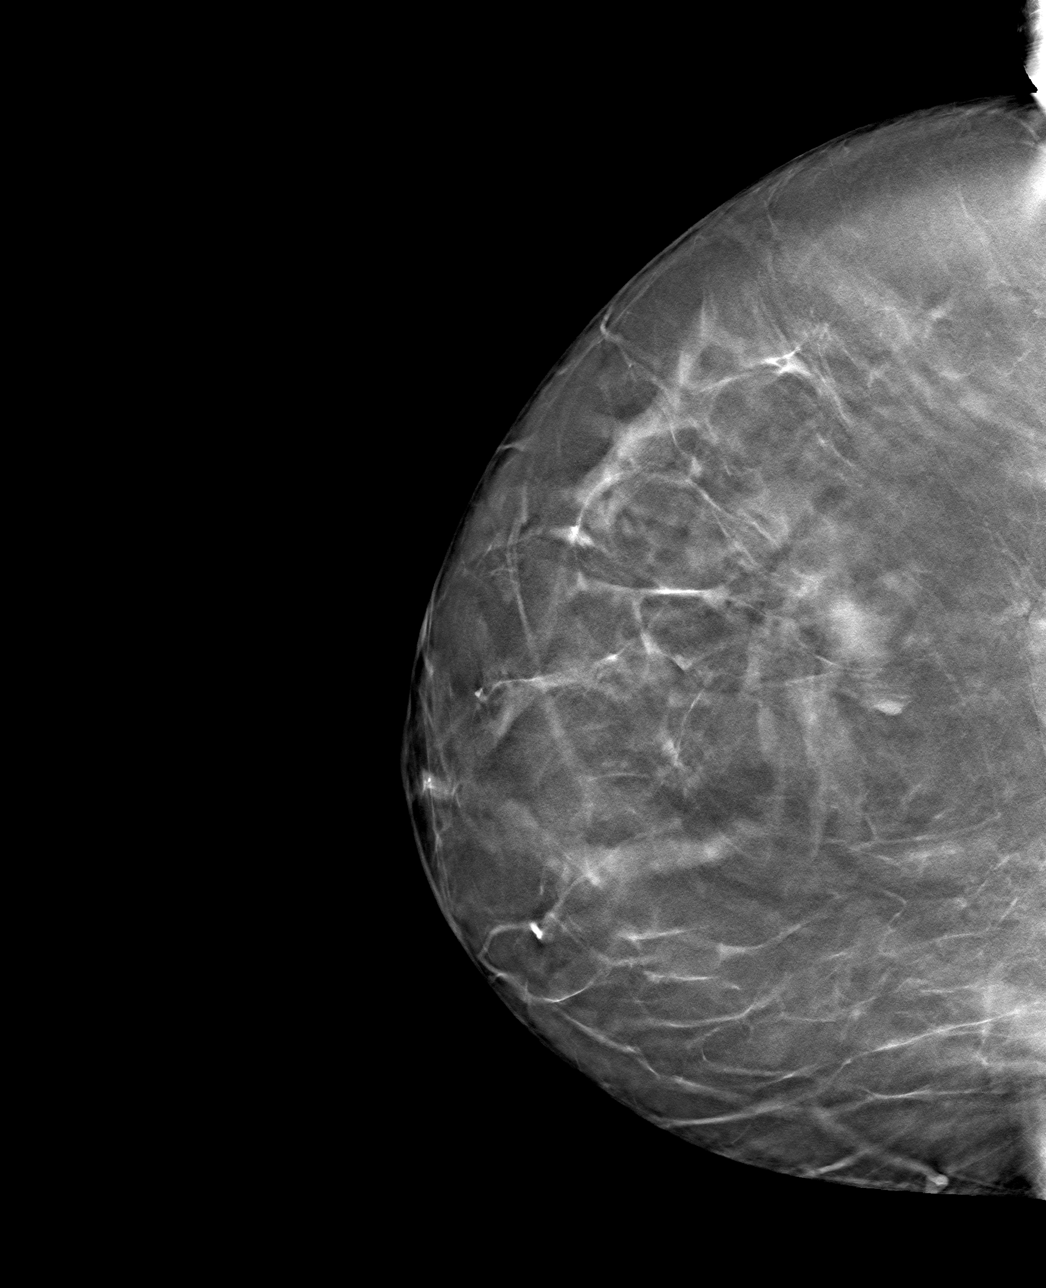

[4 of 12 positions shown; findings below may reference images not displayed]

FINDINGS: Mammographic images were obtained following stereotactic guided
biopsy of a focal right breast asymmetry. The biopsy marking clip is
in expected position at the site of biopsy.
IMPRESSION: Appropriate positioning of the coil shaped biopsy marking clip at
the site of biopsy in the lower outer quadrant of the right breast
within the area of asymmetry.

Final Assessment: Post Procedure Mammograms for Marker Placement

## 2022-07-26 ENCOUNTER — Encounter: Payer: Self-pay | Admitting: Obstetrics and Gynecology

## 2022-07-26 ENCOUNTER — Ambulatory Visit (INDEPENDENT_AMBULATORY_CARE_PROVIDER_SITE_OTHER): Payer: Managed Care, Other (non HMO) | Admitting: Obstetrics and Gynecology

## 2022-07-26 VITALS — BP 122/80 | HR 84 | Ht 61.75 in | Wt 196.0 lb

## 2022-07-26 DIAGNOSIS — R350 Frequency of micturition: Secondary | ICD-10-CM | POA: Diagnosis not present

## 2022-07-26 DIAGNOSIS — N3281 Overactive bladder: Secondary | ICD-10-CM | POA: Diagnosis not present

## 2022-07-26 DIAGNOSIS — N393 Stress incontinence (female) (male): Secondary | ICD-10-CM | POA: Diagnosis not present

## 2022-07-26 LAB — POCT URINALYSIS DIPSTICK
Bilirubin, UA: NEGATIVE
Blood, UA: NEGATIVE
Glucose, UA: NEGATIVE
Ketones, UA: NEGATIVE
Leukocytes, UA: NEGATIVE
Nitrite, UA: NEGATIVE
Protein, UA: NEGATIVE
Spec Grav, UA: 1.015 (ref 1.010–1.025)
Urobilinogen, UA: 0.2 E.U./dL
pH, UA: 7.5 (ref 5.0–8.0)

## 2022-07-26 NOTE — Progress Notes (Signed)
[facility] New Patient Evaluation and Consultation  Referring Provider: Richarda Overlie, MD PCP: Tally Joe, MD Date of Service: 07/26/2022  SUBJECTIVE Chief Complaint: New Patient (Initial Visit) Judith Simmons Judith Simmons is a 51 y.o. female here for a consult for urinary incontinence. )  History of Present Illness: Judith Simmons is a 51 y.o. White or Caucasian female seen in consultation at the request of Dr. Marcelle Overlie for evaluation of incontinence.    Review of records significant for: Has worsening SUI. She is maybe interested in physical therapy.   Urinary Symptoms: Leaks urine with cough/ sneeze, laughing, exercise, lifting, with a full bladder, with movement to the bathroom, and with urgency. SUI > UUI.  Leaks 5-6 time(s) per day.  Pad use: 2 pads per day.   She is bothered by her UI symptoms. Has not had prior treated  Day time voids 6-8.  Nocturia: 1 times per night to void. Voiding dysfunction: she does not empty her bladder well.  does not use a catheter to empty bladder.  When urinating, she feels dribbling after finishing and the need to urinate multiple times in a row Drinks: 4- 16oz bottles diet coke per day, does not really drink water, has occasional tea  UTIs:  0  UTI's in the last year.   Reports history of kidney or bladder stones  Pelvic Organ Prolapse Symptoms:                  She Denies a feeling of a bulge the vaginal area.   Bowel Symptom: Bowel movements: 1 time(s) per day Stool consistency: hard, soft , or loose (depends on diet) Straining: yes.  Splinting: yes.  Incomplete evacuation: no.  She Denies accidental bowel leakage / fecal incontinence Bowel regimen: none  Sexual Function Sexually active: yes.  Sexual orientation:  heterosexual Pain with sex: No  Pelvic Pain Denies pelvic pain   Past Medical History:  Past Medical History:  Diagnosis Date   Dysuria    Hematuria    History of kidney stones    Migraines     Ureteral calculus, left      Past Surgical History:   Past Surgical History:  Procedure Laterality Date   BREAST SURGERY Bilateral 2008   Reduction   CYSTOSCOPY WITH RETROGRADE PYELOGRAM, URETEROSCOPY AND STENT PLACEMENT Left 09/25/2012   Procedure: CYSTOSCOPY WITH RETROGRADE PYELOGRAM, URETEROSCOPY AND STENT PLACEMENT ;  Surgeon: Garnett Farm, MD;  Location: [facility];  Service: Urology;  Laterality: Left;   HOLMIUM LASER APPLICATION Left 09/25/2012   Procedure: HOLMIUM LASER APPLICATION;  Surgeon: Garnett Farm, MD;  Location: [facility];  Service: Urology;  Laterality: Left;   STONE EXTRACTION WITH BASKET Left 09/25/2012   Procedure: STONE EXTRACTION WITH BASKET;  Surgeon: Garnett Farm, MD;  Location: [facility];  Service: Urology;  Laterality: Left;   TUBAL LIGATION  2002     Past OB/GYN History: OB History  Gravida Para Term Preterm AB Living  2 2 1 1   2   SAB IAB Ectopic Multiple Live Births          2    # Outcome Date GA Lbr Len/2nd Weight Sex Delivery Anes PTL Lv  2 Term      Vag-Spont     1 Preterm      Vag-Spont      Menopausal: Denies vaginal bleeding since menopause Last pap smear was 04/2022.   History of tubal ligation.  Medications: She has a current medication list which includes the following prescription(s): bupropion er, candesartan, cyclosporine, escitalopram, estradiol, ibuprofen, and rizatriptan.   Allergies: Patient has No Known Allergies.   Social History:  Social History   Tobacco Use   Smoking status: Former    Types: Cigarettes    Quit date: 08/2007    Years since quitting: 14.9   Smokeless tobacco: Never  Vaping Use   Vaping Use: Never used  Substance Use Topics   Alcohol use: Yes    Comment: OCCASIONAL   Drug use: No    Relationship status: married She lives with husband.   She is employed. Regular exercise: No History of abuse: No  Family History:  History reviewed. No  pertinent family history.   Review of Systems: Review of Systems  Constitutional:  Positive for malaise/fatigue. Negative for fever and weight loss.  Respiratory:  Negative for cough, shortness of breath and wheezing.   Cardiovascular:  Negative for chest pain, palpitations and leg swelling.  Gastrointestinal:  Negative for abdominal pain and blood in stool.  Genitourinary:  Negative for dysuria.  Musculoskeletal:  Negative for myalgias.  Skin:  Negative for rash.  Neurological:  Negative for dizziness and headaches.  Endo/Heme/Allergies:  Does not bruise/bleed easily.       + hot flashes  Psychiatric/Behavioral:  Positive for depression. The patient is not nervous/anxious.      OBJECTIVE Physical Exam: Vitals:   07/26/22 1029  BP: 122/80  Pulse: 84  Weight: 196 lb (88.9 kg)  Height: 5' 1.75" (1.568 m)    Physical Exam Constitutional:      General: She is not in acute distress. Pulmonary:     Effort: Pulmonary effort is normal.  Abdominal:     General: There is no distension.     Palpations: Abdomen is soft.     Tenderness: There is no abdominal tenderness. There is no rebound.  Musculoskeletal:        General: No swelling. Normal range of motion.  Skin:    General: Skin is warm and dry.     Findings: No rash.  Neurological:     Mental Status: She is alert and oriented to person, place, and time.  Psychiatric:        Mood and Affect: Mood normal.        Behavior: Behavior normal.      GU / Detailed Urogynecologic Evaluation:  Pelvic Exam: Normal external female genitalia; Bartholin's and Skene's glands normal in appearance; urethral meatus normal in appearance, no urethral masses or discharge.   CST: negative  Speculum exam reveals normal vaginal mucosa with atrophy. Cervix normal appearance. Uterus normal single, nontender. Adnexa no mass, fullness, tenderness.     Pelvic floor strength I/V  Pelvic floor musculature: Right levator non-tender, Right  obturator non-tender, Left levator non-tender, Left obturator non-tender  POP-Q:   POP-Q  -3                                            Aa   -3                                           Ba  -7  C   2.5                                            Gh  3.5                                            Pb  7.5                                            tvl   -2                                            Ap  -2                                            Bp  -7                                              D      Rectal Exam:  Normal external rectum  Post-Void Residual (PVR) by Bladder Scan: In order to evaluate bladder emptying, we discussed obtaining a postvoid residual and she agreed to this procedure.  Procedure: The ultrasound unit was placed on the patient's abdomen in the suprapubic region after the patient had voided. A PVR of 106 ml was obtained by bladder scan.  Laboratory Results: POC urine: negative   ASSESSMENT AND PLAN Ms. Behnke is a 51 y.o. with:  1. SUI (stress urinary incontinence, female)   2. Urinary frequency   3. Overactive bladder    SUI - For treatment of stress urinary incontinence,  non-surgical options include expectant management, weight loss, physical therapy, as well as a pessary.  Surgical options include a midurethral sling, Burch urethropexy, and transurethral injection of a bulking agent. - She is interested in a sling. Will have her undergo urodynamic testing to demonstrate leakage and ensure she is emptying bladder well (PVR mildly elevated). Handout provided on sling.   2. OAB - We discussed the symptoms of overactive bladder (OAB), which include urinary urgency, urinary frequency, nocturia, with or without urge incontinence.  While we do not know the exact etiology of OAB, several treatment options exist. We discussed management including behavioral therapy (decreasing bladder irritants, urge  suppression strategies, timed voids, bladder retraining), physical therapy, medication.  - Discussed reducing bladder irritants, specifically soda, and drinking more water.   Return for urodynamics   Marguerita Beards, MD

## 2022-07-26 NOTE — Patient Instructions (Addendum)
Today we talked about ways to manage bladder urgency such as altering your diet to avoid irritative beverages and foods (bladder diet) as well as attempting to decrease stress and other exacerbating factors.    The Most Bothersome Foods* The Least Bothersome Foods*  Coffee - Regular & Decaf Tea - caffeinated Carbonated beverages - cola, non-colas, diet & caffeine-free Alcohols - Beer, Red Wine, White Wine, Champagne Fruits - Grapefruit, Lemon, Orange, Pineapple Fruit Juices - Cranberry, Grapefruit, Orange, Pineapple Vegetables - Tomato & Tomato Products Flavor Enhancers - Hot peppers, Spicy foods, Chili, Horseradish, Vinegar, Monosodium glutamate (MSG) Artificial Sweeteners - NutraSweet, Sweet 'N Low, Equal (sweetener), Saccharin Ethnic foods - Mexican, Thai, Indian food Water Milk - low-fat & whole Fruits - Bananas, Blueberries, Honeydew melon, Pears, Raisins, Watermelon Vegetables - Broccoli, Brussels Sprouts, Cabbage, Carrots, Cauliflower, Celery, Cucumber, Mushrooms, Peas, Radishes, Squash, Zucchini, White potatoes, Sweet potatoes & yams Poultry - Chicken, Eggs, Turkey, Meat - Beef, Pork, Lamb Seafood - Shrimp, Tuna fish, Salmon Grains - Oat, Rice Snacks - Pretzels, Popcorn  *Friedlander J. et al. Diet and its role in interstitial cystitis/bladder pain syndrome (IC/BPS) and comorbid conditions. [employer]. [employer]. 2012 Jan 11.    URODYNAMICS (UDS) TEST INFORMATION  IMPORTANT: Please try to arrive with a comfortably full bladder!   What is UDS? Urodynamics is a bladder test used to evaluate how your bladder and urethra (tube you urinate out of) work to help find out the cause of your bladder symptoms and evaluate your bladder function in order to make the best treatment plan for you.   What to expect? A nurse will perform the test and will be with you during the entire exam. First we will have to empty your bladder on a special toilet.  After you have emptied your bladder,  very small catheters (plastic tubing) will be placed into your bladder and into your vagina (or rectum). These special small catheters measure pressure to help measure your bladder function.  Your bladder will be gently filled with water and you will be asked to cough and strain at several different points during the test.   You will then be asked to empty your bladder in the special toilet with the catheters in place. Most patients can urinate (pee) easily with the catheters in place since the catheters are so small. In total this procedure lasts about 45 minutes to 1 hour.  After your test is completed, you will return (or possibly be seen the same day) to review the results, talk about treatment options and make a plan moving forward.  

## 2022-08-06 ENCOUNTER — Ambulatory Visit: Payer: Managed Care, Other (non HMO)

## 2022-08-06 NOTE — Progress Notes (Addendum)
[facility] Urodynamics Procedure  Referring Physician: Antony Contras, MD Date of Procedure: 08/07/2022  Judith Simmons is a 52 y.o. female who presents for urodynamic evaluation. Indication(s) for study: SUI and OAB  Vital Signs: BP 132/69   Pulse 82   Laboratory Results: A catheterized urine specimen revealed:  POC urine: Negative for all components   Voiding Diary:  Not done  Procedure Timeout:  The correct patient was verified and the correct procedure was verified. The patient was in the correct position and safety precautions were reviewed based on at the patient's history.  Urodynamic Procedure A 66F dual lumen urodynamics catheter was placed under sterile conditions into the patient's bladder. A 66F catheter was placed into the rectum in order to measure abdominal pressure. EMG patches were placed in the appropriate position.  All connections were confirmed and calibrations/adjusted made. Saline was instilled into the bladder through the dual lumen catheters.  Cough/valsalva pressures were measured periodically during filling.  Patient was allowed to void.  The bladder was then emptied of its residual.  UROFLOW: Revealed a Qmax of 34.9 mL/sec.  She voided 233 mL and had a residual of 50 mL.  It was a normal pattern and represented normal habits   CMG: This was performed with sterile water in the sitting position at a fill rate of 20-30 mL/min.    First sensation of fullness was 39 mLs,  First urge was 88 mLs,  Strong urge was 269 mLs and  Capacity was 414 mLs  Stress incontinence was demonstrated Lowest positive Barrier CLPP was 112 cmH20 at 151 ml. Lowest positive Barrier VLPP was 88 cmH20 at 151 ml.  Detrusor function was normal, with no phasic contractions seen.   Compliance:  Normal. End fill detrusor pressure was 0.2 cmH20.    UPP: MUCP with barrier reduction was 50 cm of water.    MICTURITION STUDY: Voiding was performed with reduction  using scopettes in the sitting position.  Pdet at Qmax was 7.5 cm of water.  Qmax was 17.8 mL/sec.  It was a normal pattern.  She voided 404 mL and had a residual of 10 mL.  It was a volitional void, sustained detrusor contraction was present and abdominal straining was not present  EMG: This was performed with patches.  She had voluntary contractions, recruitment with fill was present and urethral sphincter was not relaxed with void.  The details of the procedure with the study tracings have been scanned into EPIC.   Urodynamic Impression:  1. Sensation was increased; capacity was normal 2. Stress Incontinence was demonstrated at normal pressures; 3. Detrusor Overactivity was not demonstrated. 4. Emptying was dysfunctional with a normal PVR, a sustained detrusor contraction present,  abdominal straining not present, dyssynergic urethral sphincter activity on EMG.  Plan: - The patient will follow up  to discuss the findings and treatment options.

## 2022-08-07 ENCOUNTER — Other Ambulatory Visit: Payer: Self-pay

## 2022-08-07 ENCOUNTER — Encounter: Payer: Self-pay | Admitting: Obstetrics and Gynecology

## 2022-08-07 ENCOUNTER — Ambulatory Visit: Payer: Managed Care, Other (non HMO) | Admitting: Obstetrics and Gynecology

## 2022-08-07 ENCOUNTER — Telehealth: Payer: Self-pay | Admitting: Gastroenterology

## 2022-08-07 VITALS — BP 132/69 | HR 82

## 2022-08-07 DIAGNOSIS — N3281 Overactive bladder: Secondary | ICD-10-CM

## 2022-08-07 DIAGNOSIS — Z86010 Personal history of colonic polyps: Secondary | ICD-10-CM

## 2022-08-07 DIAGNOSIS — N393 Stress incontinence (female) (male): Secondary | ICD-10-CM | POA: Diagnosis not present

## 2022-08-07 DIAGNOSIS — R350 Frequency of micturition: Secondary | ICD-10-CM | POA: Diagnosis not present

## 2022-08-07 LAB — POCT URINALYSIS DIPSTICK
Bilirubin, UA: NEGATIVE
Blood, UA: NEGATIVE
Glucose, UA: NEGATIVE
Ketones, UA: NEGATIVE
Leukocytes, UA: NEGATIVE
Nitrite, UA: NEGATIVE
Protein, UA: NEGATIVE
Spec Grav, UA: 1.01 (ref 1.010–1.025)
Urobilinogen, UA: 0.2 E.U./dL
pH, UA: 6 (ref 5.0–8.0)

## 2022-08-07 MED ORDER — NA SULFATE-K SULFATE-MG SULF 17.5-3.13-1.6 GM/177ML PO SOLN: 1.0000 | Freq: Once | ORAL | 0 refills | Status: AC

## 2022-08-07 NOTE — Telephone Encounter (Signed)
[facility] EMR has been scheduled for 2/26 at [facility] with GM at 130 pm

## 2022-08-07 NOTE — Telephone Encounter (Signed)
Dr. Rush Landmark,  Please see referral from [facility]. Patient is being referred for polyp near appendiceal orifice, request EMR.   Records placed on your desk for review.  Thank you, Colletta [place]

## 2022-08-07 NOTE — Telephone Encounter (Signed)
Case reviewed with Dr. Randel Pigg. Color images noted on media tab. Happy to offer this patient an attempt at endoscopic resection. She can also be scheduled for a colonoscopy with EMR 75-minute case. Patient needs to have a clinic visit scheduled before the procedure. If she wants to wait until after the clinic visit to schedule that is okay as well.  Please update Dr. Randel Pigg and I when the patient is scheduled. Thanks. GM

## 2022-08-07 NOTE — Patient Instructions (Signed)

## 2022-08-08 ENCOUNTER — Other Ambulatory Visit: Payer: Self-pay

## 2022-08-08 MED ORDER — NA SULFATE-K SULFATE-MG SULF 17.5-3.13-1.6 GM/177ML PO SOLN: 1.0000 | Freq: Once | ORAL | 0 refills | Status: AC

## 2022-08-08 NOTE — Telephone Encounter (Signed)
The pt wanted to move the case to 3/4 at 730 am at [facility] with GM.  I have moved that case and re instructed.  She will pick up the prep and have on hand.  No questions at this time.

## 2022-08-16 ENCOUNTER — Ambulatory Visit: Payer: Managed Care, Other (non HMO) | Admitting: Obstetrics and Gynecology

## 2022-08-16 ENCOUNTER — Encounter: Payer: Self-pay | Admitting: Obstetrics and Gynecology

## 2022-08-16 VITALS — BP 115/66 | HR 74

## 2022-08-16 DIAGNOSIS — N393 Stress incontinence (female) (male): Secondary | ICD-10-CM | POA: Diagnosis not present

## 2022-08-16 NOTE — Progress Notes (Signed)
[facility] Return Visit  SUBJECTIVE  History of Present Illness: Judith Simmons is a 52 y.o. female seen in follow-up for after urodynamic testing.   Urodynamic Impression:  1. Sensation was increased; capacity was normal 2. Stress Incontinence was demonstrated at normal pressures; 3. Detrusor Overactivity was not demonstrated. 4. Emptying was dysfunctional with a normal PVR (24ml), a sustained detrusor contraction present,  abdominal straining not present, dyssynergic urethral sphincter activity on EMG.  Past Medical History: Patient  has a past medical history of Dysuria, Hematuria, History of kidney stones, Migraines, and Ureteral calculus, left.   Past Surgical History: She  has a past surgical history that includes Breast surgery (Bilateral, 2008); Tubal ligation (2002); Holmium laser application (Left, 0/93/2671); Cystoscopy with retrograde pyelogram, ureteroscopy and stent placement (Left, 09/25/2012); and Stone extraction with basket (Left, 09/25/2012).   Medications: She has a current medication list which includes the following prescription(s): amoxicillin, amphetamine-dextroamphetamine, bupropion er, candesartan, cyclosporine, escitalopram, estradiol, ibuprofen, and rizatriptan.   Allergies: Patient has No Known Allergies.   Social History: Patient  reports that she quit smoking about 14 years ago. Her smoking use included cigarettes. She has never used smokeless tobacco. She reports current alcohol use. She reports that she does not use drugs.      OBJECTIVE     Physical Exam: Vitals:   08/16/22 1145  BP: 115/66  Pulse: 74   Gen: No apparent distress, A&O x 3.  Detailed Urogynecologic Evaluation:  Deferred. Prior exam showed:  POP-Q (07/26/22)   -3                                            Aa   -3                                           Ba   -7                                              C    2.5                                             Gh   3.5                                            Pb   7.5                                            tvl    -2                                            Ap   -2  Bp   -7                                              D      ASSESSMENT AND PLAN    Judith Simmons is a 52 y.o. with:  1. SUI (stress urinary incontinence, female)     Plan for surgery: Exam under anesthesia, midurethral sling, cystoscopy  - We reviewed the patient's specific anatomic and functional findings, with the assistance of diagrams, and together finalized the above procedure. The planned surgical procedures were discussed along with the surgical risks outlined below, which were also provided on a detailed handout. Additional treatment options including expectant management, conservative management, medical management were discussed where appropriate.  We reviewed the benefits and risks of each treatment option.   General Surgical Risks: For all procedures, there are risks of bleeding, infection, damage to surrounding organs including but not limited to bowel, bladder, blood vessels, ureters and nerves, and need for further surgery if an injury were to occur. These risks are all low with minimally invasive surgery.   There are risks of numbness and weakness at any body site or buttock/rectal pain.  It is possible that baseline pain can be worsened by surgery, either with or without mesh. If surgery is vaginal, there is also a low risk of possible conversion to laparoscopy or open abdominal incision where indicated. Very rare risks include blood transfusion, blood clot, heart attack, pneumonia, or death.   There is also a risk of short-term postoperative urinary retention with need to use a catheter. About half of patients need to go home from surgery with a catheter, which is then later removed in the office. The risk of long-term need for a catheter is very low. There is  also a risk of worsening of overactive bladder.   Sling: The effectiveness of a midurethral vaginal mesh sling is approximately 85%, and thus, there will be times when you may leak urine after surgery, especially if your bladder is full or if you have a strong cough. There is a balance between making the sling tight enough to treat your leakage but not too tight so that you have long-term difficulty emptying your bladder. A mesh sling will not directly treat overactive bladder/urge incontinence and may worsen it.  There is an FDA safety notification on vaginal mesh procedures for prolapse but NOT mesh slings. We have extensive experience and training with mesh placement and we have close postoperative follow up to identify any potential complications from mesh. It is important to realize that this mesh is a permanent implant that cannot be easily removed. There are rare risks of mesh exposure (2-4%), pain with intercourse (0-7%), and infection (<1%). The risk of mesh exposure if more likely in a woman with risks for poor healing (prior radiation, poorly controlled diabetes, or immunocompromised). The risk of new or worsened chronic pain after mesh implant is more common in women with baseline chronic pain and/or poorly controlled anxiety or depression. Approximately 2-4% of patients will experience longer-term post-operative voiding dysfunction that may require surgical revision of the sling. We also reviewed that postoperatively, her stream may not be as strong as before surgery.   - For preop Visit:  She is required to have a visit within 30 days of her surgery.   Today we reviewed pre-operative preparation, peri-operative expectations, and post-operative  instructions/recovery.    - Medical clearance: not required  - Anticoagulant use: No - Medicaid Hysterectomy form: No - Accepts blood transfusion: Yes - Expected length of stay: outpatient  Request sent for surgery scheduling.   Marguerita Beards, MD

## 2022-08-27 ENCOUNTER — Encounter (HOSPITAL_BASED_OUTPATIENT_CLINIC_OR_DEPARTMENT_OTHER): Payer: Self-pay | Admitting: Obstetrics and Gynecology

## 2022-08-27 NOTE — Progress Notes (Signed)
Spoke w/ via phone for pre-op interview--- [facility] needs dos----   EKG            Lab results------ COVID test -----patient states asymptomatic no test needed Arrive at -------0930 NPO after MN NO Solid Food.  Clear liquids from MN until---0830 Med rec completed Medications to take morning of surgery -----Wellbutrin and Estrace Diabetic medication ----- Patient instructed no nail polish to be worn day of surgery Patient instructed to bring photo id and insurance card day of surgery Patient aware to have Driver (ride ) / caregiver  Husband Jori Moll  for 24 hours after surgery  Patient Special Instructions ----- Pre-Op special Istructions ----- Patient verbalized understanding of instructions that were given at this phone interview. Patient denies shortness of breath, chest pain, fever, cough at this phone interview.

## 2022-08-29 NOTE — H&P (Signed)
[facility] Pre-Operative H&P  Subjective Chief Complaint: Judith Simmons presents for a preoperative encounter.   History of Present Illness: Alaysiah Browder is a 52 y.o. female who presents for preoperative visit.  She is scheduled to undergo Exam under anesthesia, midurethral sling, cystoscopy on 09/05/22.  Her symptoms include stress urinary incontinence.  Urodynamic Impression:  1. Sensation was increased; capacity was normal 2. Stress Incontinence was demonstrated at normal pressures; 3. Detrusor Overactivity was not demonstrated. 4. Emptying was dysfunctional with a normal PVR (65ml), a sustained detrusor contraction present,  abdominal straining not present, dyssynergic urethral sphincter activity on EMG.  Past Medical History:  Diagnosis Date   Anxiety    Depression    Dysuria    Hematuria    History of kidney stones    Hypertension    Migraines    Ureteral calculus, left      Past Surgical History:  Procedure Laterality Date   BREAST SURGERY Bilateral 2008   Reduction   CYSTOSCOPY WITH RETROGRADE PYELOGRAM, URETEROSCOPY AND STENT PLACEMENT Left 09/25/2012   Procedure: CYSTOSCOPY WITH RETROGRADE PYELOGRAM, URETEROSCOPY AND STENT PLACEMENT ;  Surgeon: Claybon Jabs, MD;  Location[facility];  Service: Urology;  Laterality: Left;   HOLMIUM LASER APPLICATION Left 0/24/0973   Procedure: HOLMIUM LASER APPLICATION;  Surgeon: Claybon Jabs, MD;  Location: [facility];  Service: Urology;  Laterality: Left;   STONE EXTRACTION WITH BASKET Left 09/25/2012   Procedure: STONE EXTRACTION WITH BASKET;  Surgeon: Claybon Jabs, MD;  Location: [facility];  Service: Urology;  Laterality: Left;   TUBAL LIGATION  2002    has No Known Allergies.   History reviewed. No pertinent family history.  Social History   Tobacco Use   Smoking status: Former    Types: Cigarettes    Quit date: 08/2007    Years since  quitting: 15.0   Smokeless tobacco: Never  Vaping Use   Vaping Use: Never used  Substance Use Topics   Alcohol use: Yes    Comment: OCCASIONAL   Drug use: No     Review of Systems was negative for a full 10 system review except as noted in the History of Present Illness.  No current facility-administered medications for this encounter.  Current Outpatient Medications:    amphetamine-dextroamphetamine (ADDERALL XR) 15 MG 24 hr capsule, Take by mouth every morning., Disp: , Rfl:    buPROPion ER (WELLBUTRIN SR) 100 MG 12 hr tablet, Wellbutrin SR, Disp: , Rfl:    candesartan (ATACAND) 32 MG tablet, Take 1 tablet by mouth daily., Disp: , Rfl:    cycloSPORINE (RESTASIS) 0.05 % ophthalmic emulsion, INSTILL ONE DROP INTO EACH EYE TWICE DAILY AS DIRECTED, Disp: , Rfl:    escitalopram (LEXAPRO) 5 MG tablet, Take 0.5 tablets by mouth daily., Disp: , Rfl:    estradiol (ESTRACE) 1 MG tablet, Take 1 tablet by mouth daily., Disp: , Rfl:    ibuprofen (ADVIL) 800 MG tablet, ibuprofen 800 mg tablet  TAKE ONE TABLET BY MOUTH EVERY 8 HOURS AS NEEDED FOR DISCOMFORT, Disp: , Rfl:    amoxicillin (AMOXIL) 875 MG tablet, Take 875 mg by mouth 2 (two) times daily., Disp: , Rfl:    rizatriptan (MAXALT) 10 MG tablet, Take 10 mg by mouth as needed for migraine. May repeat in 2 hours if needed, Disp: , Rfl:    Objective There were no vitals filed for this visit.  Gen: NAD   Previous  Pelvic Exam showed: POP-Q (07/26/22)   -3                                            Aa   -3                                           Ba   -7                                              C    2.5                                            Gh   3.5                                            Pb   7.5                                            tvl    -2                                            Ap   -2                                            Bp   -7                                              D        Assessment/ Plan  The patient is a 52 y.o. year old with stress incontinence scheduled to undergo Exam under anesthesia, midurethral sling, cystoscopy     Jaquita Folds, MD

## 2022-09-05 ENCOUNTER — Ambulatory Visit (HOSPITAL_BASED_OUTPATIENT_CLINIC_OR_DEPARTMENT_OTHER)
Admission: RE | Admit: 2022-09-05 | Discharge: 2022-09-05 | Disposition: A | Discharge status: Home / Self Care | Payer: Managed Care, Other (non HMO) | Attending: Obstetrics and Gynecology | Admitting: Obstetrics and Gynecology

## 2022-09-05 ENCOUNTER — Ambulatory Visit (HOSPITAL_BASED_OUTPATIENT_CLINIC_OR_DEPARTMENT_OTHER): Payer: Managed Care, Other (non HMO) | Admitting: Anesthesiology

## 2022-09-05 ENCOUNTER — Encounter (HOSPITAL_BASED_OUTPATIENT_CLINIC_OR_DEPARTMENT_OTHER)
Admission: RE | Disposition: A | Discharge status: Home / Self Care | Payer: Self-pay | Source: Home / Self Care | Attending: Obstetrics and Gynecology

## 2022-09-05 ENCOUNTER — Other Ambulatory Visit: Payer: Self-pay

## 2022-09-05 ENCOUNTER — Encounter (HOSPITAL_BASED_OUTPATIENT_CLINIC_OR_DEPARTMENT_OTHER): Payer: Self-pay | Admitting: Obstetrics and Gynecology

## 2022-09-05 DIAGNOSIS — N399 Disorder of urinary system, unspecified: Secondary | ICD-10-CM

## 2022-09-05 DIAGNOSIS — N393 Stress incontinence (female) (male): Secondary | ICD-10-CM

## 2022-09-05 DIAGNOSIS — I10 Essential (primary) hypertension: Secondary | ICD-10-CM

## 2022-09-05 DIAGNOSIS — Z87891 Personal history of nicotine dependence: Secondary | ICD-10-CM | POA: Insufficient documentation

## 2022-09-05 DIAGNOSIS — F419 Anxiety disorder, unspecified: Secondary | ICD-10-CM | POA: Diagnosis not present

## 2022-09-05 DIAGNOSIS — F32A Depression, unspecified: Secondary | ICD-10-CM | POA: Insufficient documentation

## 2022-09-05 HISTORY — DX: Essential (primary) hypertension: I10

## 2022-09-05 HISTORY — PX: BLADDER SUSPENSION: SHX72

## 2022-09-05 HISTORY — PX: CYSTOSCOPY: SHX5120

## 2022-09-05 HISTORY — DX: Anxiety disorder, unspecified: F41.9

## 2022-09-05 HISTORY — DX: Depression, unspecified: F32.A

## 2022-09-05 SURGERY — URETHROPEXY, USING TRANSVAGINAL TAPE
Anesthesia: General | Site: Pelvis

## 2022-09-05 MED ORDER — CEFAZOLIN SODIUM-DEXTROSE 2-4 GM/100ML-% IV SOLN
INTRAVENOUS | Status: AC
  Filled 2022-09-05: qty 100

## 2022-09-05 MED ORDER — OXYCODONE HCL 5 MG PO TABS: 5.0000 mg | ORAL_TABLET | ORAL | 0 refills | Status: DC | PRN

## 2022-09-05 MED ORDER — FENTANYL CITRATE (PF) 100 MCG/2ML IJ SOLN
INTRAMUSCULAR | Status: DC | PRN
  Administered 2022-09-05 (×2): 50 ug via INTRAVENOUS

## 2022-09-05 MED ORDER — SCOPOLAMINE 1 MG/3DAYS TD PT72
MEDICATED_PATCH | TRANSDERMAL | Status: AC
  Filled 2022-09-05: qty 1

## 2022-09-05 MED ORDER — FENTANYL CITRATE (PF) 100 MCG/2ML IJ SOLN
INTRAMUSCULAR | Status: AC
  Filled 2022-09-05: qty 2

## 2022-09-05 MED ORDER — ONDANSETRON HCL 4 MG/2ML IJ SOLN
INTRAMUSCULAR | Status: DC | PRN
  Administered 2022-09-05: 4 mg via INTRAVENOUS

## 2022-09-05 MED ORDER — SCOPOLAMINE 1 MG/3DAYS TD PT72
1.0000 | MEDICATED_PATCH | TRANSDERMAL | Status: DC
  Administered 2022-09-05: 1.5 mg via TRANSDERMAL

## 2022-09-05 MED ORDER — ONDANSETRON HCL 4 MG/2ML IJ SOLN
INTRAMUSCULAR | Status: AC
  Filled 2022-09-05: qty 2

## 2022-09-05 MED ORDER — 0.9 % SODIUM CHLORIDE (POUR BTL) OPTIME
TOPICAL | Status: DC | PRN
  Administered 2022-09-05: 500 mL

## 2022-09-05 MED ORDER — IBUPROFEN 600 MG PO TABS: 600.0000 mg | ORAL_TABLET | Freq: Four times a day (QID) | ORAL | 0 refills | Status: DC | PRN

## 2022-09-05 MED ORDER — PROPOFOL 10 MG/ML IV BOLUS
INTRAVENOUS | Status: DC | PRN
  Administered 2022-09-05: 150 mg via INTRAVENOUS

## 2022-09-05 MED ORDER — DEXAMETHASONE SODIUM PHOSPHATE 10 MG/ML IJ SOLN
INTRAMUSCULAR | Status: AC
  Filled 2022-09-05: qty 1

## 2022-09-05 MED ORDER — PHENYLEPHRINE 80 MCG/ML (10ML) SYRINGE FOR IV PUSH (FOR BLOOD PRESSURE SUPPORT)
PREFILLED_SYRINGE | INTRAVENOUS | Status: AC
  Filled 2022-09-05: qty 10

## 2022-09-05 MED ORDER — PHENAZOPYRIDINE HCL 100 MG PO TABS
ORAL_TABLET | ORAL | Status: AC
  Filled 2022-09-05: qty 2

## 2022-09-05 MED ORDER — PROMETHAZINE HCL 25 MG/ML IJ SOLN: 6.2500 mg | INTRAMUSCULAR | Status: DC | PRN

## 2022-09-05 MED ORDER — CEFAZOLIN SODIUM-DEXTROSE 2-4 GM/100ML-% IV SOLN: 2.0000 g | INTRAVENOUS | Status: DC

## 2022-09-05 MED ORDER — KETOROLAC TROMETHAMINE 30 MG/ML IJ SOLN
INTRAMUSCULAR | Status: DC | PRN
  Administered 2022-09-05: 30 mg via INTRAVENOUS

## 2022-09-05 MED ORDER — ACETAMINOPHEN 500 MG PO TABS
ORAL_TABLET | ORAL | Status: AC
  Filled 2022-09-05: qty 2

## 2022-09-05 MED ORDER — LACTATED RINGERS IV SOLN: INTRAVENOUS | Status: DC

## 2022-09-05 MED ORDER — AMISULPRIDE (ANTIEMETIC) 5 MG/2ML IV SOLN: 10.0000 mg | Freq: Once | INTRAVENOUS | Status: DC | PRN

## 2022-09-05 MED ORDER — DEXAMETHASONE SODIUM PHOSPHATE 4 MG/ML IJ SOLN
INTRAMUSCULAR | Status: DC | PRN
  Administered 2022-09-05: 10 mg via INTRAVENOUS

## 2022-09-05 MED ORDER — EPHEDRINE 5 MG/ML INJ
INTRAVENOUS | Status: AC
  Filled 2022-09-05: qty 5

## 2022-09-05 MED ORDER — FENTANYL CITRATE (PF) 100 MCG/2ML IJ SOLN
25.0000 ug | INTRAMUSCULAR | Status: DC | PRN
  Administered 2022-09-05 (×2): 50 ug via INTRAVENOUS

## 2022-09-05 MED ORDER — PHENAZOPYRIDINE HCL 100 MG PO TABS
200.0000 mg | ORAL_TABLET | ORAL | Status: AC
  Administered 2022-09-05: 200 mg via ORAL

## 2022-09-05 MED ORDER — MIDAZOLAM HCL 2 MG/2ML IJ SOLN
INTRAMUSCULAR | Status: DC | PRN
  Administered 2022-09-05: 2 mg via INTRAVENOUS

## 2022-09-05 MED ORDER — ACETAMINOPHEN 500 MG PO TABS
1000.0000 mg | ORAL_TABLET | ORAL | Status: AC
  Administered 2022-09-05: 1000 mg via ORAL

## 2022-09-05 MED ORDER — ACETAMINOPHEN 500 MG PO TABS: 500.0000 mg | ORAL_TABLET | Freq: Four times a day (QID) | ORAL | 0 refills | Status: DC | PRN

## 2022-09-05 MED ORDER — SODIUM CHLORIDE 0.9 % IR SOLN
Status: DC | PRN
  Administered 2022-09-05: 1000 mL via INTRAVESICAL

## 2022-09-05 MED ORDER — LIDOCAINE-EPINEPHRINE 1 %-1:100000 IJ SOLN
INTRAMUSCULAR | Status: DC | PRN
  Administered 2022-09-05: 10 mL

## 2022-09-05 MED ORDER — MIDAZOLAM HCL 2 MG/2ML IJ SOLN
INTRAMUSCULAR | Status: AC
  Filled 2022-09-05: qty 2

## 2022-09-05 MED ORDER — LIDOCAINE HCL (CARDIAC) PF 100 MG/5ML IV SOSY
PREFILLED_SYRINGE | INTRAVENOUS | Status: DC | PRN
  Administered 2022-09-05: 60 mg via INTRAVENOUS

## 2022-09-05 MED ORDER — PHENYLEPHRINE HCL (PRESSORS) 10 MG/ML IV SOLN
INTRAVENOUS | Status: DC | PRN
  Administered 2022-09-05: 80 ug via INTRAVENOUS

## 2022-09-05 MED ORDER — POLYETHYLENE GLYCOL 3350 17 GM/SCOOP PO POWD: 17.0000 g | Freq: Every day | ORAL | 0 refills | Status: DC

## 2022-09-05 SURGICAL SUPPLY — 34 items
ADH SKN CLS APL DERMABOND .7 (GAUZE/BANDAGES/DRESSINGS) ×2
AGENT HMST KT MTR STRL THRMB (HEMOSTASIS)
BLADE CLIPPER SENSICLIP SURGIC (BLADE) ×2 IMPLANT
BLADE SURG 15 STRL LF DISP TIS (BLADE) ×2 IMPLANT
BLADE SURG 15 STRL SS (BLADE) ×2
DERMABOND ADVANCED .7 DNX12 (GAUZE/BANDAGES/DRESSINGS) ×2 IMPLANT
ELECT REM PT RETURN 9FT ADLT (ELECTROSURGICAL)
ELECTRODE REM PT RTRN 9FT ADLT (ELECTROSURGICAL) IMPLANT
GAUZE 4X4 16PLY ~~LOC~~+RFID DBL (SPONGE) IMPLANT
GLOVE BIOGEL PI IND STRL 6.5 (GLOVE) ×2 IMPLANT
GLOVE ECLIPSE 6.0 STRL STRAW (GLOVE) ×2 IMPLANT
GOWN STRL REUS W/TWL LRG LVL3 (GOWN DISPOSABLE) ×2 IMPLANT
HIBICLENS CHG 4% 4OZ BTL (MISCELLANEOUS) ×2 IMPLANT
HOLDER FOLEY CATH W/STRAP (MISCELLANEOUS) ×2 IMPLANT
KIT TURNOVER CYSTO (KITS) ×2 IMPLANT
MANIFOLD NEPTUNE II (INSTRUMENTS) ×2 IMPLANT
NEEDLE HYPO 22GX1.5 SAFETY (NEEDLE) ×2 IMPLANT
NS IRRIG 1000ML POUR BTL (IV SOLUTION) ×2 IMPLANT
PACK CYSTO (CUSTOM PROCEDURE TRAY) ×2 IMPLANT
PACK VAGINAL WOMENS (CUSTOM PROCEDURE TRAY) ×2 IMPLANT
RETRACTOR LONE STAR DISPOSABLE (INSTRUMENTS) ×2 IMPLANT
RETRACTOR STAY HOOK 5MM (MISCELLANEOUS) ×2 IMPLANT
SET IRRIG Y TYPE TUR BLADDER L (SET/KITS/TRAYS/PACK) ×2 IMPLANT
SPIKE FLUID TRANSFER (MISCELLANEOUS) ×2 IMPLANT
SUCTION FRAZIER HANDLE 10FR (MISCELLANEOUS) ×2
SUCTION TUBE FRAZIER 10FR DISP (MISCELLANEOUS) ×2 IMPLANT
SURGIFLO W/THROMBIN 8M KIT (HEMOSTASIS) IMPLANT
SUT ABS MONO DBL WITH NDL 48IN (SUTURE) IMPLANT
SUT VIC AB 2-0 SH 27 (SUTURE) ×2
SUT VIC AB 2-0 SH 27XBRD (SUTURE) ×2 IMPLANT
SYR BULB EAR ULCER 3OZ GRN STR (SYRINGE) ×2 IMPLANT
SYS SLING ADV FIT BLUE TRNSVAG (Sling) IMPLANT
TOWEL OR 17X26 10 PK STRL BLUE (TOWEL DISPOSABLE) ×2 IMPLANT
TRAY FOLEY W/BAG SLVR 14FR (SET/KITS/TRAYS/PACK) ×2 IMPLANT

## 2022-09-05 NOTE — Anesthesia Procedure Notes (Signed)
Procedure Name: LMA Insertion Date/Time: 09/05/2022 11:45 AM  Performed by: Georgeanne Nim, CRNAPre-anesthesia Checklist: Patient identified, Emergency Drugs available, Patient being monitored, Suction available and Timeout performed Patient Re-evaluated:Patient Re-evaluated prior to induction Oxygen Delivery Method: Circle system utilized Preoxygenation: Pre-oxygenation with 100% oxygen Induction Type: IV induction Ventilation: Mask ventilation without difficulty LMA: LMA inserted LMA Size: 4.0 Number of attempts: 1 Tube secured with: Tape Dental Injury: Teeth and Oropharynx as per pre-operative assessment

## 2022-09-05 NOTE — Interval H&P Note (Signed)
History and Physical Interval Note:  09/05/2022 11:13 AM  Judith Simmons  has presented today for surgery, with the diagnosis of stress urinary incontinence.  The various methods of treatment have been discussed with the patient and family. After consideration of risks, benefits and other options for treatment, the patient has consented to  Procedure(s) with comments: TRANSVAGINAL TAPE (TVT) PROCEDURE (N/A) CYSTOSCOPY (N/A) as a surgical intervention.   Vitals:   09/05/22 0955  BP: 122/72  Pulse: 89  Resp: 16  Temp: 97.7 F (36.5 C)  SpO2: 97%    Gen: NAD CV: S1 S2 RRR Lungs: Clear to auscultation bilaterally Abd: soft, nontender   The patient's history has been reviewed, patient examined, no change in status, stable for surgery.  I have reviewed the patient's chart and labs.  Questions were answered to the patient's satisfaction.     Jaquita Folds

## 2022-09-05 NOTE — Anesthesia Preprocedure Evaluation (Addendum)
Anesthesia Evaluation  Patient identified by MRN, date of birth, ID band Patient awake    Reviewed: Allergy & Precautions, NPO status , Patient's Chart, lab work & pertinent test results  History of Anesthesia Complications Negative for: history of anesthetic complications  Airway Mallampati: II  TM Distance: >3 FB Neck ROM: Full    Dental no notable dental hx. (+) Dental Advisory Given   Pulmonary former smoker   Pulmonary exam normal        Cardiovascular negative cardio ROS Normal cardiovascular exam     Neuro/Psych  PSYCHIATRIC DISORDERS Anxiety Depression    negative neurological ROS     GI/Hepatic negative GI ROS, Neg liver ROS,,,  Endo/Other  negative endocrine ROS    Renal/GU negative Renal ROS     Musculoskeletal negative musculoskeletal ROS (+)    Abdominal   Peds  Hematology negative hematology ROS (+)   Anesthesia Other Findings   Reproductive/Obstetrics                             Anesthesia Physical Anesthesia Plan  ASA: 2  Anesthesia Plan: General   Post-op Pain Management: Toradol IV (intra-op)* and Tylenol PO (pre-op)*   Induction: Intravenous  PONV Risk Score and Plan: 4 or greater and Ondansetron, Dexamethasone, Midazolam and Scopolamine patch - Pre-op  Airway Management Planned: LMA  Additional Equipment:   Intra-op Plan:   Post-operative Plan: Extubation in OR  Informed Consent: I have reviewed the patients History and Physical, chart, labs and discussed the procedure including the risks, benefits and alternatives for the proposed anesthesia with the patient or authorized representative who has indicated his/her understanding and acceptance.     Dental advisory given  Plan Discussed with: Anesthesiologist and CRNA  Anesthesia Plan Comments:        Anesthesia Quick Evaluation

## 2022-09-05 NOTE — Transfer of Care (Signed)
Immediate Anesthesia Transfer of Care Note  Patient: Judith Simmons  Procedure(s) Performed: TRANSVAGINAL TAPE (TVT) PROCEDURE (Pelvis) CYSTOSCOPY (Bladder)  Patient Location: PACU  Anesthesia Type:General  Level of Consciousness: awake, alert , and oriented  Airway & Oxygen Therapy: Patient Spontanous Breathing  Post-op Assessment: Report given to RN and Post -op Vital signs reviewed and stable  Post vital signs: Reviewed and stable  Last Vitals:  Vitals Value Taken Time  BP 130/74 09/05/22 1230  Temp 36.7 C 09/05/22 1230  Pulse 81 09/05/22 1237  Resp 18 09/05/22 1237  SpO2 96 % 09/05/22 1237  Vitals shown include unvalidated device data.  Last Pain:  Vitals:   09/05/22 1230  TempSrc:   PainSc: 5       Patients Stated Pain Goal: 5 (89/38/10 1751)  Complications: No notable events documented.

## 2022-09-05 NOTE — Discharge Instructions (Addendum)
POST OPERATIVE INSTRUCTIONS  General Instructions Recovery (not bed rest) will last approximately 2- 6 weeks Walking is encouraged, but refrain from strenuous exercise/ housework/ heavy lifting for 2 weeks. No lifting >10lbs  Nothing in the vagina for 6 weeks- NO intercourse, tampons or douching Bathing:  Do not submerge in water (NO swimming, bath, hot tub, etc) until after your postop visit. You can shower starting the day after surgery.  No driving until you are not taking narcotic pain medicine and until your pain is well enough controlled that you can slam on the breaks or make sudden movements if needed.   Taking your medications Please take your acetaminophen and ibuprofen on a schedule for the first 48 hours. Take 600mg  ibuprofen, then take 500mg  acetaminophen 3 hours later, then continue to alternate ibuprofen and acetaminophen. That way you are taking each type of medication every 6 hours. Take the prescribed narcotic (oxycodone, tramadol, etc) as needed, with a maximum being every 4 hours.  Take a stool softener daily to keep your stools soft and preventing you from straining. If you have diarrhea, you decrease your stool softener. This is explained more below. We have prescribed you Miralax.  Reasons to Call the Nurse (see last page for phone numbers) Heavy Bleeding (changing your pad every 1-2 hours) Persistent nausea/vomiting Fever (100.4 degrees or more) Incision problems (pus or other fluid coming out, redness, warmth, increased pain)  Things to Expect After Surgery Mild to Moderate pain is normal during the first day or two after surgery. If prescribed, take Ibuprofen or Tylenol first and use the stronger medicine for "break-through" pain. You can overlap these medicines because they work differently.   Constipation   To Prevent Constipation:  Eat a well-balanced diet including protein, grains, fresh fruit and vegetables.  Drink plenty of fluids. Walk regularly.  Depending  on specific instructions from your physician: take Miralax daily and additionally you can add a stool softener (colace/ docusate) and fiber supplement. Continue as long as you're on pain medications.   To Treat Constipation:  If you do not have a bowel movement in 2 days after surgery, you can take 2 Tbs of Milk of Magnesia 1-2 times a day until you have a bowel movement. If diarrhea occurs, decrease the amount or stop the laxative. If no results with Milk of Magnesia, you can drink a bottle of magnesium citrate which you can purchase over the counter.  Fatigue:  This is a normal response to surgery and will improve with time.  Plan frequent rest periods throughout the day.  Gas Pain:  This is very common but can also be very painful! Drink warm liquids such as herbal teas, bouillon or soup. Walking will help you pass more gas.  Mylicon or Gas-X can be taken over the counter.  Leaking Urine:  Varying amounts of leakage may occur after surgery.  This should improve with time. Your bladder needs at least 3 months to recover from surgery. If you leak after surgery, be sure to mention this to your doctor at your post-op visit. If you were taking medications for overactive bladder prior to surgery, be sure to restart the medications immediately after surgery.  Incisions: If you have incisions on your abdomen, the skin glue will dissolve on its own over time. It is ok to gently rinse with soap and water over these incisions but do not scrub.  Catheter Approximately 50% of patients are unable to urinate after surgery and need to go home with  a catheter. This allows your bladder to rest so it can return to full function. If you go home with a catheter, the office will call to set up a voiding trial a few days after surgery. For most patients, by this visit, they are able to urinate on their own. Long term catheter use is rare.   Return to Work  As work demands and recovery times vary widely, it is hard to  predict when you will want to return to work. If you have a desk job with no strenuous physical activity, and if you would like to return sooner than generally recommended, discuss this with your provider or call our office.   Post op concerns  For non-emergent issues, please call the Urogynecology Nurse. Please leave a message and someone will contact you within one business day.  You can also send a message through [place].   AFTER HOURS (After 5:00 PM and on weekends):  For urgent matters that cannot wait until the next business day. Call our office [phone] and connect to the doctor on call.  Please reserve this for important issues.   **FOR ANY TRUE EMERGENCY ISSUES CALL 911 OR GO TO THE NEAREST EMERGENCY ROOM.** Please inform our office or the doctor on call of any emergency.     APPOINTMENTS: Call [phone]    Post Anesthesia Home Care Instructions  Activity: Get plenty of rest for the remainder of the day. A responsible individual must stay with you for 24 hours following the procedure.  For the next 24 hours, DO NOT: -Drive a car -Paediatric nurse -Drink alcoholic beverages -Take any medication unless instructed by your physician -Make any legal decisions or sign important papers.  Meals: Start with liquid foods such as gelatin or soup. Progress to regular foods as tolerated. Avoid greasy, spicy, heavy foods. If nausea and/or vomiting occur, drink only clear liquids until the nausea and/or vomiting subsides. Call your physician if vomiting continues.  Special Instructions/Symptoms: Your throat may feel dry or sore from the anesthesia or the breathing tube placed in your throat during surgery. If this causes discomfort, gargle with warm salt water. The discomfort should disappear within 24 hours.  If you had a scopolamine patch placed behind your ear for the management of post- operative nausea and/or vomiting:  1. The medication in the patch is effective for 72  hours, after which it should be removed.  Wrap patch in a tissue and discard in the trash. Wash hands thoroughly with soap and water. 2. You may remove the patch earlier than 72 hours if you experience unpleasant side effects which may include dry mouth, dizziness or visual disturbances. 3. Avoid touching the patch. Wash your hands with soap and water after contact with the patch.    Remove patch behind left ear by Saturday, September 08, 2022.

## 2022-09-05 NOTE — Op Note (Signed)
Operative Note  Preoperative Diagnosis: stress urinary incontinence  Postoperative Diagnosis: same  Procedures performed:  Midurethral sling (Advantage Fit), cystoscopy  Implants:  Implant Name Type Inv. Item Serial No. Manufacturer Lot No. LRB No. Used Action  SYS SLING ADV FIT BLUE TRNSVAG - [id number] Sling SYS SLING ADV FIT BLUE TRNSVAG  Wilkie Aye [id number] N/A 1 Implanted    Attending Surgeon: Sherlene Shams, MD  Anesthesia: General LMA  Findings: On cystoscopy, normal bladder and urethra without injury, lesion or foreign body. Brisk bilateral ureteral efflux noted.    Specimens: none  Estimated blood loss: 50 mL  IV fluids: 400 mL  Urine output: 50 mL  Complications: none  Procedure in Detail:  After informed consent was obtained, the patient was taken to the operating room where she was placed under anesthesia.  She was then placed in the dorsal lithotomy position with allen stirrups,  and prepped and draped in the usual sterile fashion.  A strap was placed across her upper abdomen on top of her gown so it was not in direct contact with her skin.  Care was taken to avoid hyperflexion or hyperextension of her upper and lower extremities. A foley catheter was placed.  A lonestar self-retraining retractor was placed with 4 stay hooks. The mid urethral area was located on the anterior vaginal wall.  Two Allis clamps were placed at the level of the midurethra. 1% lidocaine with epinephrine was injected into the vaginal mucosa. A vertical incision was made between the two clamps using a 15-blade scalpel.  Using sharp dissection, Metzenbaum scissors were used to make a periurethral tunnel from the vaginal incision towards the pubic rami bilaterally for the future sling tracts. The bladder was ensured to be empty. The trocar and attached sling were introduced into the right side of the periurethral vaginal incision, just inferior to the pubic symphysis on the right side. The  trocar was guided through the endopelvic fascia and directly vertically.  While hugging the cephalad surface of the pubic bone, the trocar was guided out through the abdomen 2 fingerbreadths lateral to midline at the level of the pubic symphysis on the ipsilateral side. The trocar was placed on the left side in a similar fashion.  A 70-degree cystoscope was introduced, and 360-degree inspection revealed no trauma or trocars in the bladder, with brisk bilateral ureteral efflux.  The bladder was drained and the cystoscope was removed.  The Foley catheter was reinserted.  The sling was brought to lie beneath the mid-urethra.  A needle driver was placed behind the sling to ensure no tension.   The plastic sheath was removed from the sling and the distal ends of the sling were trimmed just below the level of the skin incisions.  Tension-free positioning of the sling was confirmed. Vaginal inspection revealed no vaginotomy or sling perforations of the mucosa.  The vaginal mucosal edges were reapproximated using 2-0 Vicryl.  The vagina was copiously irrigated.  Hemostasis was again noted. Vaginal packing not placed. The suprapubic sling incisions were closed with Dermabond. The patient tolerated the procedure well.  She was awakened from anesthesia and transferred to the recovery room in stable condition. All needle and sponge counts were correct x 2.    Jaquita Folds, MD

## 2022-09-05 NOTE — Anesthesia Postprocedure Evaluation (Signed)
Anesthesia Post Note  Patient: Judith Simmons  Procedure(s) Performed: TRANSVAGINAL TAPE (TVT) PROCEDURE (Pelvis) CYSTOSCOPY (Bladder)     Patient location during evaluation: PACU Anesthesia Type: General Level of consciousness: sedated Pain management: pain level controlled Vital Signs Assessment: post-procedure vital signs reviewed and stable Respiratory status: spontaneous breathing and respiratory function stable Cardiovascular status: stable Postop Assessment: no apparent nausea or vomiting Anesthetic complications: no   No notable events documented.  Last Vitals:  Vitals:   09/05/22 1300 09/05/22 1358  BP: 120/64 115/66  Pulse: 66 79  Resp: (!) 9 14  Temp:  36.6 C  SpO2: 93% 94%    Last Pain:  Vitals:   09/05/22 1358  TempSrc:   PainSc: 2                  [name] DANIEL

## 2022-09-05 NOTE — Progress Notes (Signed)
Instilled 300 ML NS as ordered and foley cath removed. Up to bathroom to void 200 ml. Bladder scan revealed 0 ML. Dr. Wannetta Sender aware. OK for discharge.

## 2022-09-06 ENCOUNTER — Telehealth: Payer: Self-pay | Admitting: Obstetrics and Gynecology

## 2022-09-06 NOTE — Telephone Encounter (Signed)
Patient reports she is doing well Has some pressure with sitting but reports minimal pain.  Denies leaking. Reports she has been urinating normally.  Denies having a bowel movement yet. Reports she is taking miralax Encouraged to take dose of milk of magnesia if unable to have a bowel movement by tomorrow.  Encouraged to call with any concerns but overall patient feels she is doing well.

## 2022-09-06 NOTE — Telephone Encounter (Signed)
Judith Simmons underwent midurethral sling, cystosocpy on 09/05/22.   She passed her voiding trial.  3258m was backfilled into the bladder Voided 2261m PVR by bladder scan was 58m48m  She was discharged without a catheter. Please call her for a routine post op check. Thanks!  MicJaquita FoldsD

## 2022-09-09 ENCOUNTER — Encounter (HOSPITAL_BASED_OUTPATIENT_CLINIC_OR_DEPARTMENT_OTHER): Payer: Self-pay | Admitting: Obstetrics and Gynecology

## 2022-09-16 ENCOUNTER — Encounter: Payer: Self-pay | Admitting: *Deleted

## 2022-09-23 ENCOUNTER — Encounter (HOSPITAL_COMMUNITY): Payer: Self-pay | Admitting: Gastroenterology

## 2022-09-23 NOTE — Progress Notes (Signed)
Attempted to obtain medical history via telephone, unable to reach at this time. HIPAA compliant voicemail message left requesting return call to pre surgical testing department. 

## 2022-09-26 ENCOUNTER — Telehealth: Payer: Self-pay | Admitting: Gastroenterology

## 2022-09-26 NOTE — Telephone Encounter (Signed)
The pt states she wants to cancel the 3/4 colon EMR and reschedule after her appt with Dr Rush Landmark on 4/30.  Appt cancelled.

## 2022-09-26 NOTE — Telephone Encounter (Signed)
Patient is calling looking to reschedule her hospital procedure 3/4. Please advise

## 2022-09-30 ENCOUNTER — Encounter (HOSPITAL_COMMUNITY): Admission: RE | Payer: Self-pay | Source: Home / Self Care

## 2022-09-30 ENCOUNTER — Ambulatory Visit (HOSPITAL_COMMUNITY)
Admission: RE | Admit: 2022-09-30 | Payer: Managed Care, Other (non HMO) | Source: Home / Self Care | Admitting: Gastroenterology

## 2022-09-30 SURGERY — COLONOSCOPY WITH PROPOFOL
Anesthesia: Monitor Anesthesia Care

## 2022-10-18 ENCOUNTER — Encounter: Payer: Managed Care, Other (non HMO) | Admitting: Obstetrics and Gynecology

## 2022-10-22 ENCOUNTER — Encounter: Payer: Managed Care, Other (non HMO) | Admitting: Obstetrics and Gynecology

## 2022-10-22 ENCOUNTER — Ambulatory Visit (INDEPENDENT_AMBULATORY_CARE_PROVIDER_SITE_OTHER): Payer: Managed Care, Other (non HMO) | Admitting: Obstetrics and Gynecology

## 2022-10-22 ENCOUNTER — Encounter: Payer: Self-pay | Admitting: Obstetrics and Gynecology

## 2022-10-22 VITALS — BP 126/77 | HR 108

## 2022-10-22 DIAGNOSIS — Z9889 Other specified postprocedural states: Secondary | ICD-10-CM

## 2022-10-22 NOTE — Progress Notes (Signed)
[facility]  Date of Visit: 10/22/2022  History of Present Illness: Judith Simmons is a 52 y.o. female scheduled today for a post-operative visit.   Surgery: s/p midurethral sling, cystoscopy on 09/05/22  She passed her postoperative void trial.   Postoperative course has been uncomplicated.   Today she reports she is doing well and is very happy with results of the sling. She did have intercourse about 1 week ago. Denies vaginal bleeding.   UTI in the last 6 weeks? No  Pain? No  She has returned to her normal activity (except for postop restrictions) Vaginal bulge? No  Stress incontinence: No  Urgency/frequency: No  Urge incontinence: No  Voiding dysfunction: No  Bowel issues: No    Medications: She has a current medication list which includes the following prescription(s): acetaminophen, amphetamine-dextroamphetamine, bupropion er, candesartan, cyclosporine, escitalopram, estradiol, ibuprofen, ibuprofen, oxycodone, polyethylene glycol powder, and rizatriptan.   Allergies: Patient has No Known Allergies.   Physical Exam: BP 126/77   Pulse (!) 108   LMP 07/22/2012    Suprapubic Incisions: healing well.  Pelvic Examination: Vagina: Incisions healing well. No sutures present, no granulation tissue.  No visible or palpable mesh.  POP-Q: deferred ---------------------------------------------------------  Assessment and Plan:  1. Post-operative state    - Well healed. Has had good results from sling.  - Can resume regular activity.   Return as needed  Jaquita Folds, MD

## 2022-11-26 ENCOUNTER — Ambulatory Visit (INDEPENDENT_AMBULATORY_CARE_PROVIDER_SITE_OTHER): Payer: Managed Care, Other (non HMO) | Admitting: Gastroenterology

## 2022-11-26 ENCOUNTER — Other Ambulatory Visit (INDEPENDENT_AMBULATORY_CARE_PROVIDER_SITE_OTHER): Payer: Managed Care, Other (non HMO)

## 2022-11-26 ENCOUNTER — Encounter: Payer: Self-pay | Admitting: Gastroenterology

## 2022-11-26 VITALS — BP 118/68 | Ht 62.0 in | Wt 199.0 lb

## 2022-11-26 DIAGNOSIS — R933 Abnormal findings on diagnostic imaging of other parts of digestive tract: Secondary | ICD-10-CM

## 2022-11-26 DIAGNOSIS — Z86010 Personal history of colonic polyps: Secondary | ICD-10-CM

## 2022-11-26 DIAGNOSIS — K388 Other specified diseases of appendix: Secondary | ICD-10-CM | POA: Diagnosis not present

## 2022-11-26 LAB — CBC
HCT: 40.8 % (ref 36.0–46.0)
Hemoglobin: 13.7 g/dL (ref 12.0–15.0)
MCHC: 33.7 g/dL (ref 30.0–36.0)
MCV: 87.2 fl (ref 78.0–100.0)
Platelets: 347 10*3/uL (ref 150.0–400.0)
RBC: 4.68 Mil/uL (ref 3.87–5.11)
RDW: 13.6 % (ref 11.5–15.5)
WBC: 9.1 10*3/uL (ref 4.0–10.5)

## 2022-11-26 LAB — PROTIME-INR
INR: 1 ratio (ref 0.8–1.0)
Prothrombin Time: 10.5 s (ref 9.6–13.1)

## 2022-11-26 LAB — BASIC METABOLIC PANEL
BUN: 14 mg/dL (ref 6–23)
CO2: 26 mEq/L (ref 19–32)
Calcium: 9.6 mg/dL (ref 8.4–10.5)
Chloride: 102 mEq/L (ref 96–112)
Creatinine, Ser: 0.66 mg/dL (ref 0.40–1.20)
GFR: 99.96 mL/min (ref >60.00)
Glucose, Bld: 90 mg/dL (ref 70–99)
Potassium: 4.2 mEq/L (ref 3.5–5.1)
Sodium: 137 mEq/L (ref 135–145)

## 2022-11-26 NOTE — Patient Instructions (Addendum)
Your provider has requested that you go to the basement level for lab work before leaving today. Press "B" on the elevator. The lab is located at the first door on the left as you exit the elevator.  You have been scheduled for a CT scan of the abdomen and pelvis at [facility], 1st floor Radiology. You are scheduled on 12/12/22 at 5:30 pm .You should arrive at 3:15 pm to drink contrast and register for CT scan.  Please follow the written instructions below on the day of your exam:   1) Do not eat anything after 1:30 pm (4 hours prior to your test)     You may take any medications as prescribed with a small amount of water, if necessary. If you take any of the following medications: METFORMIN, GLUCOPHAGE, GLUCOVANCE, AVANDAMET, RIOMET, FORTAMET, ACTOPLUS MET, JANUMET, GLUMETZA or METAGLIP, you MAY be asked to HOLD this medication 48 hours AFTER the exam.   The purpose of you drinking the oral contrast is to aid in the visualization of your intestinal tract. The contrast solution may cause some diarrhea. Depending on your individual set of symptoms, you may also receive an intravenous injection of x-ray contrast/dye. Plan on being at [facility] for 45 minutes or longer, depending on the type of exam you are having performed.   If you have any questions regarding your exam or if you need to reschedule, you may call [facility] at [phone] between the hours of 8:00 am and 5:00 pm, Monday-Friday.   _______________________________________________________  If your blood pressure at your visit was 140/90 or greater, please contact your primary care physician to follow up on this.  _______________________________________________________  If you are age 43 or older, your body mass index should be between 23-30. Your Body mass index is 36.4 kg/m. If this is out of the aforementioned range listed, please consider follow up with your Primary Care Provider.  If you are age 75 or  younger, your body mass index should be between 19-25. Your Body mass index is 36.4 kg/m. If this is out of the aformentioned range listed, please consider follow up with your Primary Care Provider.   ________________________________________________________  The [facility] providers would like to encourage you to use [facility] to communicate with providers for non-urgent requests or questions.  Due to long hold times on the telephone, sending your provider a message by [facility] may be a faster and more efficient way to get a response.  Please allow 48 business hours for a response.  Please remember that this is for non-urgent requests.  _______________________________________________________  Thank you for choosing me and [facility].  Dr. Meridee Score

## 2022-11-30 DIAGNOSIS — R933 Abnormal findings on diagnostic imaging of other parts of digestive tract: Secondary | ICD-10-CM | POA: Insufficient documentation

## 2022-11-30 DIAGNOSIS — Z86010 Personal history of colonic polyps: Secondary | ICD-10-CM | POA: Insufficient documentation

## 2022-11-30 DIAGNOSIS — K388 Other specified diseases of appendix: Secondary | ICD-10-CM | POA: Insufficient documentation

## 2022-11-30 NOTE — H&P (View-Only) (Signed)
 GASTROENTEROLOGY OUTPATIENT CLINIC VISIT   Primary Care Provider Swayne, David, MD [address] [place] [address] [phone]  Referring Provider Dr. Vreeland  Patient Profile: Judith Simmons is a 51 y.o. female with a pmh significant for anxiety, MDD, nephrolithiasis, migraines, colon polyps (AO adenoma in situ).  The patient presents to the [facility] for an evaluation and management of problem(s) noted below:  Problem List 1. Hx of adenomatous colonic polyps   2. Polyp of appendix   3. Abnormal colonoscopy     History of Present Illness This is the patient's first visit to the outpatient  GI clinic.  The patient underwent her first screening colonoscopy in January with Dr. Vreeland at [facility].  She had some nodular appearing mucosa around the appendiceal orifice and this was biopsied and returned as an adenoma.  It is for this reason the patient is referred for consideration of advanced endoscopic resection versus need for OVESCO FTRD.  The patient is not having any significant changes in her bowel habits and no blood in her stools.  She is hopeful to minimize surgery if possible.  GI Review of Systems Positive as above Negative for dysphagia, odynophagia, nausea, vomiting, pain, change in bowel habits, melena, hematochezia  Review of Systems General: Denies fevers/chills/weight loss unintentionally Cardiovascular: Denies chest pain Pulmonary: Denies shortness of breath Gastroenterological: See HPI Genitourinary: Denies darkened urine Hematological: Denies easy bruising/bleeding Dermatological: Denies jaundice Psychological: Mood is stable   Medications Current Outpatient Medications  Medication Sig Dispense Refill   amphetamine-dextroamphetamine (ADDERALL XR) 15 MG 24 hr capsule Take by mouth every morning.     buPROPion ER (WELLBUTRIN SR) 100 MG 12 hr tablet Wellbutrin SR     candesartan (ATACAND) 32 MG tablet  Take 1 tablet by mouth daily.     estradiol (ESTRACE) 1 MG tablet Take 1 tablet by mouth daily.     progesterone (PROMETRIUM) 100 MG capsule Take 100 mg by mouth at bedtime.     rizatriptan (MAXALT) 10 MG tablet Take 10 mg by mouth as needed for migraine. May repeat in 2 hours if needed     acetaminophen (TYLENOL) 500 MG tablet Take 1 tablet (500 mg total) by mouth every 6 (six) hours as needed (pain). (Patient not taking: Reported on 11/26/2022) 30 tablet 0   escitalopram (LEXAPRO) 5 MG tablet Take 0.5 tablets by mouth daily. (Patient not taking: Reported on 11/26/2022)     ibuprofen (ADVIL) 600 MG tablet Take 1 tablet (600 mg total) by mouth every 6 (six) hours as needed. (Patient not taking: Reported on 11/26/2022) 30 tablet 0   ibuprofen (ADVIL) 800 MG tablet ibuprofen 800 mg tablet  TAKE ONE TABLET BY MOUTH EVERY 8 HOURS AS NEEDED FOR DISCOMFORT (Patient not taking: Reported on 11/26/2022)     polyethylene glycol powder (GLYCOLAX/MIRALAX) 17 GM/SCOOP powder Take 17 g by mouth daily. Drink 17g (1 scoop) dissolved in water per day. (Patient not taking: Reported on 11/26/2022) 255 g 0   No current facility-administered medications for this visit.    Allergies No Known Allergies  Histories Past Medical History:  Diagnosis Date   Anxiety    Depression    Dysuria    Hematuria    History of kidney stones    Migraines    Ureteral calculus, left    Past Surgical History:  Procedure Laterality Date   BLADDER SUSPENSION N/A 09/05/2022   Procedure: TRANSVAGINAL TAPE (TVT) PROCEDURE;  Surgeon: Schroeder, Michelle N,   MD;  Location: [facility];  Service: Gynecology;  Laterality: N/A;  Total time requested is 1 hour   BREAST SURGERY Bilateral 2008   Reduction   CYSTOSCOPY N/A 09/05/2022   Procedure: CYSTOSCOPY;  Surgeon: Schroeder, Michelle N, MD;  Location: [facility];  Service: Gynecology;  Laterality: N/A;   CYSTOSCOPY WITH RETROGRADE PYELOGRAM, URETEROSCOPY AND  STENT PLACEMENT Left 09/25/2012   Procedure: CYSTOSCOPY WITH RETROGRADE PYELOGRAM, URETEROSCOPY AND STENT PLACEMENT ;  Surgeon: Mark C Ottelin, MD;  Location: [facility];  Service: Urology;  Laterality: Left;   HOLMIUM LASER APPLICATION Left 09/25/2012   Procedure: HOLMIUM LASER APPLICATION;  Surgeon: Mark C Ottelin, MD;  Location: [facility];  Service: Urology;  Laterality: Left;   STONE EXTRACTION WITH BASKET Left 09/25/2012   Procedure: STONE EXTRACTION WITH BASKET;  Surgeon: Mark C Ottelin, MD;  Location: [facility];  Service: Urology;  Laterality: Left;   TUBAL LIGATION  2002   Social History   Socioeconomic History   Marital status: Married    Spouse name: Not on file   Number of children: 2   Years of education: Not on file   Highest education level: Not on file  Occupational History   Occupation: it manger  Tobacco Use   Smoking status: Former    Types: Cigarettes    Quit date: 08/2007    Years since quitting: 15.2   Smokeless tobacco: Never  Vaping Use   Vaping Use: Never used  Substance and Sexual Activity   Alcohol use: Yes    Comment: OCCASIONAL   Drug use: No   Sexual activity: Yes  Other Topics Concern   Not on file  Social History Narrative   Not on file   Social Determinants of Health   Financial Resource Strain: Not on file  Food Insecurity: Not on file  Transportation Needs: Not on file  Physical Activity: Not on file  Stress: Not on file  Social Connections: Not on file  Intimate Partner Violence: Not on file   Family History  Problem Relation Age of Onset   Liver disease Neg Hx    I have reviewed her medical, social, and family history in detail and updated the electronic medical record as necessary.    PHYSICAL EXAMINATION  BP 118/68   Ht 5' 2" (1.575 m)   Wt 199 lb (90.3 kg)   LMP 07/22/2012   BMI 36.40 kg/m  Wt Readings from Last 3 Encounters:  11/26/22 199 lb (90.3 kg)  09/05/22 194 lb  4.8 oz (88.1 kg)  07/26/22 196 lb (88.9 kg)  GEN: NAD, appears stated age, doesn't appear chronically ill PSYCH: Cooperative, without pressured speech EYE: Conjunctivae pink, sclerae anicteric ENT: MMM CV: Nontachycardic RESP: No audible wheezing GI: NABS, soft, NT/ND, without rebound MSK/EXT: No significant lower extremity edema SKIN: No jaundice NEURO:  Alert & Oriented x 3, no focal deficits   REVIEW OF DATA  I reviewed the following data at the time of this encounter:  GI Procedures and Studies  January 2024 colonoscopy [facility] Nonthrombosed external hemorrhoids found on digital rectal exam. Nodular mucosa at the appendiceal orifice.  Biopsied.  Laboratory Studies  Reviewed those in epic  Imaging Studies  No relevant studies to review   ASSESSMENT  Ms. Trigo is a 51 y.o. female with a pmh significant for The patient is seen today for evaluation and management of:  1. Hx of adenomatous colonic polyps   2.   Polyp of appendix   3. Abnormal colonoscopy    The patient is hemodynamically and clinically stable.  Based upon the description and endoscopic pictures I do feel that it is reasonable to pursue an Advanced Polypectomy attempt of the polyp/lesion.  I do think it is reasonable to obtain a CT abdomen/pelvis to evaluate the appendix and make sure that we are not dealing with already a dilated appendix or mucocele where in which surgical intervention may be needed or required rather than endoscopic intervention.  We discussed some of the techniques of advanced polypectomy which include Endoscopic Mucosal Resection, OVESCO Full-Thickness Resection, Endorotor Morcellation, and Tissue Ablation via Fulguration.  We also reviewed images of typical techniques as noted above.  The risks and benefits of endoscopic evaluation were discussed with the patient; these include but are not limited to the risk of perforation, infection, bleeding, missed lesions, lack of diagnosis, severe  illness requiring hospitalization, as well as anesthesia and sedation related illnesses.  During attempts at advanced resection, the risks of bleeding and perforation/leak are increased as opposed to diagnostic and screening procedures, and that was discussed with the patient as well.   In addition, I explained that with the possible need for piecemeal resection, subsequent short-interval endoscopic evaluation for follow up and potential retreatment of the lesion/area may be necessary.  I did offer, a referral to surgery in order for patient to have opportunity to discuss surgical management/intervention prior to finalizing decision for attempt at endoscopic removal, however, the patient deferred on this.  If, after attempt at removal of the polyp/lesion, it is found that the patient has a complication or that an invasive lesion or malignant lesion is found, or that the polyp/lesion continues to recur, the patient is aware and understands that surgery may still be indicated/required.  Should we use OVESCO FTRD, we will consider antibiotic therapy pre and post procedure and the consideration of 23-hour observation pending how the patient is doing, but hopefully we will not need to do a significant resection at bedside.  All patient questions were answered, to the best of my ability, and the patient agrees to the aforementioned plan of action with follow-up as indicated.    PLAN  Preprocedure labs to be obtained Proceed with scheduling CT abdomen/pelvis to ensure appendix is not dilated where in which attempt at endoscopic resection may not be most ideal surgical resection may need to be considered first With scheduling colonoscopy for attempt at appendiceal orifice and be ready for OVESCO FTRD   Orders Placed This Encounter  Procedures   CT Abdomen Pelvis W Contrast   CBC   Basic Metabolic Panel (BMET)   INR/PT   Ambulatory referral to Gastroenterology    New Prescriptions   No medications on file    Modified Medications   No medications on file    Planned Follow Up No follow-ups on file.   Total Time in Face-to-Face and in Coordination of Care for patient including independent/personal interpretation/review of prior testing, medical history, examination, medication adjustment, communicating results with the patient directly, and documentation within the EHR is 45 minutes..   Aspyn Warnke Mansouraty, MD [facility] Advanced Endoscopy Office # [phone]  

## 2022-11-30 NOTE — Progress Notes (Signed)
GASTROENTEROLOGY OUTPATIENT CLINIC VISIT   Primary Care Provider Tally Joe, MD [address] [place] [address] [phone]  Referring Provider Dr. Lorenso Quarry  Patient Profile: Judith Simmons is a 52 y.o. female with a pmh significant for anxiety, MDD, nephrolithiasis, migraines, colon polyps (AO adenoma in situ).  The patient presents to the [facility] for an evaluation and management of problem(s) noted below:  Problem List 1. Hx of adenomatous colonic polyps   2. Polyp of appendix   3. Abnormal colonoscopy     History of Present Illness This is the patient's first visit to the outpatient [facility] clinic.  The patient underwent her first screening colonoscopy in January with Dr. Lorenso Quarry at [facility].  She had some nodular appearing mucosa around the appendiceal orifice and this was biopsied and returned as an adenoma.  It is for this reason the patient is referred for consideration of advanced endoscopic resection versus need for OVESCO FTRD.  The patient is not having any significant changes in her bowel habits and no blood in her stools.  She is hopeful to minimize surgery if possible.  GI Review of Systems Positive as above Negative for dysphagia, odynophagia, nausea, vomiting, pain, change in bowel habits, melena, hematochezia  Review of Systems General: Denies fevers/chills/weight loss unintentionally Cardiovascular: Denies chest pain Pulmonary: Denies shortness of breath Gastroenterological: See HPI Genitourinary: Denies darkened urine Hematological: Denies easy bruising/bleeding Dermatological: Denies jaundice Psychological: Mood is stable   Medications Current Outpatient Medications  Medication Sig Dispense Refill   amphetamine-dextroamphetamine (ADDERALL XR) 15 MG 24 hr capsule Take by mouth every morning.     buPROPion ER (WELLBUTRIN SR) 100 MG 12 hr tablet Wellbutrin SR     candesartan (ATACAND) 32 MG tablet  Take 1 tablet by mouth daily.     estradiol (ESTRACE) 1 MG tablet Take 1 tablet by mouth daily.     progesterone (PROMETRIUM) 100 MG capsule Take 100 mg by mouth at bedtime.     rizatriptan (MAXALT) 10 MG tablet Take 10 mg by mouth as needed for migraine. May repeat in 2 hours if needed     acetaminophen (TYLENOL) 500 MG tablet Take 1 tablet (500 mg total) by mouth every 6 (six) hours as needed (pain). (Patient not taking: Reported on 11/26/2022) 30 tablet 0   escitalopram (LEXAPRO) 5 MG tablet Take 0.5 tablets by mouth daily. (Patient not taking: Reported on 11/26/2022)     ibuprofen (ADVIL) 600 MG tablet Take 1 tablet (600 mg total) by mouth every 6 (six) hours as needed. (Patient not taking: Reported on 11/26/2022) 30 tablet 0   ibuprofen (ADVIL) 800 MG tablet ibuprofen 800 mg tablet  TAKE ONE TABLET BY MOUTH EVERY 8 HOURS AS NEEDED FOR DISCOMFORT (Patient not taking: Reported on 11/26/2022)     polyethylene glycol powder (GLYCOLAX/MIRALAX) 17 GM/SCOOP powder Take 17 g by mouth daily. Drink 17g (1 scoop) dissolved in water per day. (Patient not taking: Reported on 11/26/2022) 255 g 0   No current facility-administered medications for this visit.    Allergies No Known Allergies  Histories Past Medical History:  Diagnosis Date   Anxiety    Depression    Dysuria    Hematuria    History of kidney stones    Migraines    Ureteral calculus, left    Past Surgical History:  Procedure Laterality Date   BLADDER SUSPENSION N/A 09/05/2022   Procedure: TRANSVAGINAL TAPE (TVT) PROCEDURE;  Surgeon: Marguerita Beards,  MD;  Location: [facility];  Service: Gynecology;  Laterality: N/A;  Total time requested is 1 hour   BREAST SURGERY Bilateral 2008   Reduction   CYSTOSCOPY N/A 09/05/2022   Procedure: CYSTOSCOPY;  Surgeon: Marguerita Beards, MD;  Location: [facility];  Service: Gynecology;  Laterality: N/A;   CYSTOSCOPY WITH RETROGRADE PYELOGRAM, URETEROSCOPY AND  STENT PLACEMENT Left 09/25/2012   Procedure: CYSTOSCOPY WITH RETROGRADE PYELOGRAM, URETEROSCOPY AND STENT PLACEMENT ;  Surgeon: Garnett Farm, MD;  Location: [facility];  Service: Urology;  Laterality: Left;   HOLMIUM LASER APPLICATION Left 09/25/2012   Procedure: HOLMIUM LASER APPLICATION;  Surgeon: Garnett Farm, MD;  Location: [facility];  Service: Urology;  Laterality: Left;   STONE EXTRACTION WITH BASKET Left 09/25/2012   Procedure: STONE EXTRACTION WITH BASKET;  Surgeon: Garnett Farm, MD;  Location: [facility];  Service: Urology;  Laterality: Left;   TUBAL LIGATION  2002   Social History   Socioeconomic History   Marital status: Married    Spouse name: Not on file   Number of children: 2   Years of education: Not on file   Highest education level: Not on file  Occupational History   Occupation: it manger  Tobacco Use   Smoking status: Former    Types: Cigarettes    Quit date: 08/2007    Years since quitting: 15.2   Smokeless tobacco: Never  Vaping Use   Vaping Use: Never used  Substance and Sexual Activity   Alcohol use: Yes    Comment: OCCASIONAL   Drug use: No   Sexual activity: Yes  Other Topics Concern   Not on file  Social History Narrative   Not on file   Social Determinants of Health   Financial Resource Strain: Not on file  Food Insecurity: Not on file  Transportation Needs: Not on file  Physical Activity: Not on file  Stress: Not on file  Social Connections: Not on file  Intimate Partner Violence: Not on file   Family History  Problem Relation Age of Onset   Liver disease Neg Hx    I have reviewed her medical, social, and family history in detail and updated the electronic medical record as necessary.    PHYSICAL EXAMINATION  BP 118/68   Ht 5\' 2"  (1.575 m)   Wt 199 lb (90.3 kg)   LMP 07/22/2012   BMI 36.40 kg/m  Wt Readings from Last 3 Encounters:  11/26/22 199 lb (90.3 kg)  09/05/22 194 lb  4.8 oz (88.1 kg)  07/26/22 196 lb (88.9 kg)  GEN: NAD, appears stated age, doesn't appear chronically ill PSYCH: Cooperative, without pressured speech EYE: Conjunctivae pink, sclerae anicteric ENT: MMM CV: Nontachycardic RESP: No audible wheezing GI: NABS, soft, NT/ND, without rebound MSK/EXT: No significant lower extremity edema SKIN: No jaundice NEURO:  Alert & Oriented x 3, no focal deficits   REVIEW OF DATA  I reviewed the following data at the time of this encounter:  GI Procedures and Studies  January 2024 colonoscopy [facility] Nonthrombosed external hemorrhoids found on digital rectal exam. Nodular mucosa at the appendiceal orifice.  Biopsied.  Laboratory Studies  Reviewed those in epic  Imaging Studies  No relevant studies to review   ASSESSMENT  Ms. Ilg is a 52 y.o. female with a pmh significant for The patient is seen today for evaluation and management of:  1. Hx of adenomatous colonic polyps   2.  Polyp of appendix   3. Abnormal colonoscopy    The patient is hemodynamically and clinically stable.  Based upon the description and endoscopic pictures I do feel that it is reasonable to pursue an Advanced Polypectomy attempt of the polyp/lesion.  I do think it is reasonable to obtain a CT abdomen/pelvis to evaluate the appendix and make sure that we are not dealing with already a dilated appendix or mucocele where in which surgical intervention may be needed or required rather than endoscopic intervention.  We discussed some of the techniques of advanced polypectomy which include Endoscopic Mucosal Resection, OVESCO Full-Thickness Resection, Endorotor Morcellation, and Tissue Ablation via Fulguration.  We also reviewed images of typical techniques as noted above.  The risks and benefits of endoscopic evaluation were discussed with the patient; these include but are not limited to the risk of perforation, infection, bleeding, missed lesions, lack of diagnosis, severe  illness requiring hospitalization, as well as anesthesia and sedation related illnesses.  During attempts at advanced resection, the risks of bleeding and perforation/leak are increased as opposed to diagnostic and screening procedures, and that was discussed with the patient as well.   In addition, I explained that with the possible need for piecemeal resection, subsequent short-interval endoscopic evaluation for follow up and potential retreatment of the lesion/area may be necessary.  I did offer, a referral to surgery in order for patient to have opportunity to discuss surgical management/intervention prior to finalizing decision for attempt at endoscopic removal, however, the patient deferred on this.  If, after attempt at removal of the polyp/lesion, it is found that the patient has a complication or that an invasive lesion or malignant lesion is found, or that the polyp/lesion continues to recur, the patient is aware and understands that surgery may still be indicated/required.  Should we use OVESCO FTRD, we will consider antibiotic therapy pre and post procedure and the consideration of 23-hour observation pending how the patient is doing, but hopefully we will not need to do a significant resection at bedside.  All patient questions were answered, to the best of my ability, and the patient agrees to the aforementioned plan of action with follow-up as indicated.    PLAN  Preprocedure labs to be obtained Proceed with scheduling CT abdomen/pelvis to ensure appendix is not dilated where in which attempt at endoscopic resection may not be most ideal surgical resection may need to be considered first With scheduling colonoscopy for attempt at appendiceal orifice and be ready for OVESCO FTRD   Orders Placed This Encounter  Procedures   CT Abdomen Pelvis W Contrast   CBC   Basic Metabolic Panel (BMET)   INR/PT   Ambulatory referral to Gastroenterology    New Prescriptions   No medications on file    Modified Medications   No medications on file    Planned Follow Up No follow-ups on file.   Total Time in Face-to-Face and in Coordination of Care for patient including independent/personal interpretation/review of prior testing, medical history, examination, medication adjustment, communicating results with the patient directly, and documentation within the EHR is 45 minutes.Corliss Parish, MD [facility] Advanced Endoscopy Office # [phone]

## 2022-12-11 ENCOUNTER — Encounter (HOSPITAL_COMMUNITY): Payer: Self-pay | Admitting: Gastroenterology

## 2022-12-11 NOTE — Progress Notes (Signed)
Attempted to obtain medical history via telephone, unable to reach at this time. HIPAA compliant voicemail message left requesting return call to pre surgical testing department. 

## 2022-12-12 ENCOUNTER — Encounter: Payer: Self-pay | Admitting: Gastroenterology

## 2022-12-12 ENCOUNTER — Ambulatory Visit (HOSPITAL_COMMUNITY)
Admission: RE | Admit: 2022-12-12 | Discharge: 2022-12-12 | Disposition: A | Discharge status: Home / Self Care | Payer: Managed Care, Other (non HMO) | Source: Ambulatory Visit | Attending: Gastroenterology | Admitting: Gastroenterology

## 2022-12-12 DIAGNOSIS — Z86010 Personal history of colonic polyps: Secondary | ICD-10-CM | POA: Insufficient documentation

## 2022-12-12 MED ORDER — IOHEXOL 300 MG/ML  SOLN
100.0000 mL | Freq: Once | INTRAMUSCULAR | Status: AC | PRN
  Administered 2022-12-12: 100 mL via INTRAVENOUS

## 2022-12-18 ENCOUNTER — Telehealth: Payer: Self-pay | Admitting: Gastroenterology

## 2022-12-18 NOTE — Telephone Encounter (Signed)
Prep Instructions

## 2022-12-19 ENCOUNTER — Ambulatory Visit (HOSPITAL_COMMUNITY)
Admission: RE | Admit: 2022-12-19 | Discharge: 2022-12-19 | Disposition: A | Discharge status: Home / Self Care | Payer: Managed Care, Other (non HMO) | Attending: Gastroenterology | Admitting: Gastroenterology

## 2022-12-19 ENCOUNTER — Encounter (HOSPITAL_COMMUNITY)
Admission: RE | Disposition: A | Discharge status: Home / Self Care | Payer: Self-pay | Source: Home / Self Care | Attending: Gastroenterology

## 2022-12-19 ENCOUNTER — Ambulatory Visit (HOSPITAL_COMMUNITY): Payer: Managed Care, Other (non HMO) | Admitting: Anesthesiology

## 2022-12-19 ENCOUNTER — Ambulatory Visit (HOSPITAL_BASED_OUTPATIENT_CLINIC_OR_DEPARTMENT_OTHER): Payer: Managed Care, Other (non HMO) | Admitting: Anesthesiology

## 2022-12-19 ENCOUNTER — Encounter (HOSPITAL_COMMUNITY): Payer: Self-pay | Admitting: Gastroenterology

## 2022-12-19 ENCOUNTER — Other Ambulatory Visit: Payer: Self-pay

## 2022-12-19 DIAGNOSIS — F418 Other specified anxiety disorders: Secondary | ICD-10-CM | POA: Diagnosis not present

## 2022-12-19 DIAGNOSIS — F419 Anxiety disorder, unspecified: Secondary | ICD-10-CM | POA: Insufficient documentation

## 2022-12-19 DIAGNOSIS — K644 Residual hemorrhoidal skin tags: Secondary | ICD-10-CM | POA: Insufficient documentation

## 2022-12-19 DIAGNOSIS — K635 Polyp of colon: Secondary | ICD-10-CM | POA: Insufficient documentation

## 2022-12-19 DIAGNOSIS — F32A Depression, unspecified: Secondary | ICD-10-CM | POA: Insufficient documentation

## 2022-12-19 DIAGNOSIS — K641 Second degree hemorrhoids: Secondary | ICD-10-CM | POA: Insufficient documentation

## 2022-12-19 DIAGNOSIS — Z87891 Personal history of nicotine dependence: Secondary | ICD-10-CM | POA: Insufficient documentation

## 2022-12-19 DIAGNOSIS — Z86010 Personal history of colonic polyps: Secondary | ICD-10-CM | POA: Diagnosis not present

## 2022-12-19 DIAGNOSIS — Z09 Encounter for follow-up examination after completed treatment for conditions other than malignant neoplasm: Secondary | ICD-10-CM | POA: Insufficient documentation

## 2022-12-19 DIAGNOSIS — D128 Benign neoplasm of rectum: Secondary | ICD-10-CM | POA: Diagnosis not present

## 2022-12-19 HISTORY — PX: POLYPECTOMY: SHX5525

## 2022-12-19 HISTORY — PX: COLONOSCOPY WITH PROPOFOL: SHX5780

## 2022-12-19 HISTORY — PX: SUBMUCOSAL LIFTING INJECTION: SHX6855

## 2022-12-19 HISTORY — PX: HEMOSTASIS CONTROL: SHX6838

## 2022-12-19 HISTORY — PX: ENDOSCOPIC MUCOSAL RESECTION: SHX6839

## 2022-12-19 SURGERY — COLONOSCOPY WITH PROPOFOL
Anesthesia: Monitor Anesthesia Care

## 2022-12-19 MED ORDER — PROPOFOL 500 MG/50ML IV EMUL
INTRAVENOUS | Status: DC | PRN
  Administered 2022-12-19: 150 ug/kg/min via INTRAVENOUS

## 2022-12-19 MED ORDER — CIPROFLOXACIN IN D5W 400 MG/200ML IV SOLN
INTRAVENOUS | Status: AC
  Filled 2022-12-19: qty 200

## 2022-12-19 MED ORDER — PHENYLEPHRINE 80 MCG/ML (10ML) SYRINGE FOR IV PUSH (FOR BLOOD PRESSURE SUPPORT)
PREFILLED_SYRINGE | INTRAVENOUS | Status: DC | PRN
  Administered 2022-12-19 (×6): 80 ug via INTRAVENOUS

## 2022-12-19 MED ORDER — LACTATED RINGERS IV SOLN: INTRAVENOUS | Status: DC

## 2022-12-19 MED ORDER — CIPROFLOXACIN IN D5W 400 MG/200ML IV SOLN
INTRAVENOUS | Status: DC | PRN
  Administered 2022-12-19: 400 mg via INTRAVENOUS

## 2022-12-19 MED ORDER — LIDOCAINE 2% (20 MG/ML) 5 ML SYRINGE
INTRAMUSCULAR | Status: DC | PRN
  Administered 2022-12-19: 60 mg via INTRAVENOUS

## 2022-12-19 MED ORDER — PROPOFOL 10 MG/ML IV BOLUS
INTRAVENOUS | Status: DC | PRN
  Administered 2022-12-19: 50 mg via INTRAVENOUS
  Administered 2022-12-19: 25 mg via INTRAVENOUS
  Administered 2022-12-19: 30 mg via INTRAVENOUS

## 2022-12-19 MED ORDER — AMOXICILLIN-POT CLAVULANATE 875-125 MG PO TABS: 1.0000 | ORAL_TABLET | Freq: Two times a day (BID) | ORAL | 0 refills | Status: AC

## 2022-12-19 SURGICAL SUPPLY — 22 items

## 2022-12-19 NOTE — Interval H&P Note (Signed)
History and Physical Interval Note:  12/19/2022 10:16 AM  Judith Simmons  has presented today for surgery, with the diagnosis of colon polyp.  The various methods of treatment have been discussed with the patient and family. After consideration of risks, benefits and other options for treatment, the patient has consented to  Procedure(s): COLONOSCOPY WITH PROPOFOL (N/A) ENDOSCOPIC MUCOSAL RESECTION (N/A) as a surgical intervention.  The patient's history has been reviewed, patient examined, no change in status, stable for surgery.  I have reviewed the patient's chart and labs.  Questions were answered to the patient's satisfaction.     [employer]

## 2022-12-19 NOTE — Anesthesia Preprocedure Evaluation (Addendum)
Anesthesia Evaluation  Patient identified by MRN, date of birth, ID band Patient awake    Reviewed: Allergy & Precautions, NPO status , Patient's Chart, lab work & pertinent test results  Airway Mallampati: II  TM Distance: >3 FB Neck ROM: Full    Dental no notable dental hx.    Pulmonary former smoker   Pulmonary exam normal        Cardiovascular negative cardio ROS Normal cardiovascular exam     Neuro/Psych  Headaches PSYCHIATRIC DISORDERS Anxiety Depression       GI/Hepatic negative GI ROS, Neg liver ROS, Bowel prep,,,  Endo/Other  negative endocrine ROS    Renal/GU negative Renal ROS     Musculoskeletal negative musculoskeletal ROS (+)    Abdominal  (+) + obese  Peds  Hematology negative hematology ROS (+)   Anesthesia Other Findings colon polyp  Reproductive/Obstetrics                             Anesthesia Physical Anesthesia Plan  ASA: 2  Anesthesia Plan: MAC   Post-op Pain Management:    Induction: Intravenous  PONV Risk Score and Plan: 3 and Propofol infusion and Treatment may vary due to age or medical condition  Airway Management Planned: Nasal Cannula  Additional Equipment:   Intra-op Plan:   Post-operative Plan:   Informed Consent: I have reviewed the patients History and Physical, chart, labs and discussed the procedure including the risks, benefits and alternatives for the proposed anesthesia with the patient or authorized representative who has indicated his/her understanding and acceptance.     Dental advisory given  Plan Discussed with: CRNA  Anesthesia Plan Comments:        Anesthesia Quick Evaluation

## 2022-12-19 NOTE — Transfer of Care (Signed)
Immediate Anesthesia Transfer of Care Note  Patient: Judith Simmons  Procedure(s) Performed: COLONOSCOPY WITH PROPOFOL ENDOSCOPIC MUCOSAL RESECTION POLYPECTOMY HEMOSTASIS CONTROL  Patient Location: PACU  Anesthesia Type:MAC  Level of Consciousness: awake and patient cooperative  Airway & Oxygen Therapy: Patient Spontanous Breathing  Post-op Assessment: Report given to RN and Post -op Vital signs reviewed and stable  Post vital signs: Reviewed and stable  Last Vitals:  Vitals Value Taken Time  BP 91/32 12/19/22 1130  Temp    Pulse 75 12/19/22 1131  Resp 14 12/19/22 1131  SpO2 98 % 12/19/22 1131  Vitals shown include unvalidated device data.  Last Pain: There were no vitals filed for this visit.       Complications: No notable events documented.

## 2022-12-19 NOTE — Anesthesia Procedure Notes (Signed)
Procedure Name: MAC Date/Time: 12/19/2022 10:42 AM  Performed by: Adria Dill, CRNAPre-anesthesia Checklist: Patient identified, Emergency Drugs available, Suction available and Patient being monitored Patient Re-evaluated:Patient Re-evaluated prior to induction Oxygen Delivery Method: Nasal cannula Preoxygenation: Pre-oxygenation with 100% oxygen Induction Type: IV induction Placement Confirmation: positive ETCO2 and breath sounds checked- equal and bilateral Dental Injury: Teeth and Oropharynx as per pre-operative assessment

## 2022-12-19 NOTE — Op Note (Signed)
[facility] Patient Name: Judith Simmons Procedure Date: 12/19/2022 MRN: [id number] Attending MD: Corliss Parish , MD, [id number] Date of Birth: 20-Apr-1971 CSN: [id number] Age: 52 Admit Type: Outpatient Procedure:                Colonoscopy Indications:              Excision of colonic polyp (appendiceal orifice                            polyp) Providers:                Corliss Parish, MD, Lorenza Evangelist, RN, Harrington Challenger, Technician Referring MD:             Liliane Shi DO, DO, Tally Joe Medicines:                Monitored Anesthesia Care, Cipro 400 mg IV Complications:            No immediate complications. Estimated Blood Loss:     Estimated blood loss was minimal. Procedure:                Pre-Anesthesia Assessment:                           - Prior to the procedure, a History and Physical                            was performed, and patient medications and                            allergies were reviewed. The patient's tolerance of                            previous anesthesia was also reviewed. The risks                            and benefits of the procedure and the sedation                            options and risks were discussed with the patient.                            All questions were answered, and informed consent                            was obtained. Prior Anticoagulants: The patient has                            taken no anticoagulant or antiplatelet agents                            except for NSAID medication. ASA Grade Assessment:  II - A patient with mild systemic disease. After                            reviewing the risks and benefits, the patient was                            deemed in satisfactory condition to undergo the                            procedure.                           After obtaining informed consent, the colonoscope                             was passed under direct vision. Throughout the                            procedure, the patient's blood pressure, pulse, and                            oxygen saturations were monitored continuously. The                            CF-HQ190L ([id number]) Olympus colonoscope was                            introduced through the anus and advanced to the 4                            cm into the ileum. The colonoscopy was technically                            difficult and complex due to Location of the                            appendiceal orifice polyp. Successful completion of                            the procedure was aided by performing the maneuvers                            documented (below) in this report. The patient                            tolerated the procedure. The quality of the bowel                            preparation was adequate. The terminal ileum,                            ileocecal valve, appendiceal orifice, and rectum  were photographed. Scope In: 10:47:08 AM Scope Out: 11:22:51 AM Scope Withdrawal Time: 0 hours 31 minutes 29 seconds  Total Procedure Duration: 0 hours 35 minutes 43 seconds  Findings:      Skin tags were found on perianal exam.      The digital rectal exam findings include hemorrhoids. Pertinent       negatives include no palpable rectal lesions.      The colon (entire examined portion) revealed moderately excessive       looping.      A moderate amount of semi-liquid stool was found in the entire colon,       precluding visualization. Lavage of the area was performed using copious       amounts, resulting in clearance with adequate visualization.      The terminal ileum and ileocecal valve appeared normal.      A 24 mm polypoid lesion was found at the periappendiceal orifice and       within the appendiceal orifice. The lesion was granular lateral       spreading and spreading downwards into the appendiceal orifice  a few       millimeters. No bleeding was present. Preparations were made for attempt       at mucosal resection. Demarcation of the lesion was performed with       high-definition white light and narrow band imaging to clearly identify       the boundaries of the lesion. EverLift was injected to raise the lesion       and I was able to feel like we were able to get the majority of the       lesion out of the orifice. Piecemeal mucosal resection using a snare was       performed. Resection and retrieval were complete. Resected tissue       margins were examined and clear of polyp tissue. Area was successfully       injected with 2 mL PuraStat for hemostasis.      Four sessile polyps were found in the rectum. The polyps were 2 to 3 mm       in size. These polyps were removed with a cold snare. Resection and       retrieval were complete.      Normal mucosa was found in the entire colon otherwise.      Non-bleeding non-thrombosed internal and external hemorrhoids were found       during retroflexion, during perianal exam and during digital exam. The       hemorrhoids were Grade II (internal hemorrhoids that prolapse but reduce       spontaneously). Impression:               - Perianal skin tags found on perianal exam.                            Hemorrhoids found on digital rectal exam.                           - There was significant looping of the colon.                           - Stool in the entire examined colon - this was  lavaged with adequate visualization.                           - The examined portion of the ileum was normal.                           - Polypoid lesion at the appendiceal orifice.                            Complete removal was accomplished. Injected.                           - Four, 2 to 3 mm polyps in the rectum, removed                            with a cold snare. Resected and retrieved.                           - Normal mucosa in  the entire examined colon                            otherwise.                           - Non-bleeding non-thrombosed internal and external                            hemorrhoids. Moderate Sedation:      Not Applicable - Patient had care per Anesthesia. Recommendation:           - The patient will be observed post-procedure,                            until all discharge criteria are met.                           - Discharge patient to home.                           - Patient has a contact number available for                            emergencies. The signs and symptoms of potential                            delayed complications were discussed with the                            patient. Return to normal activities tomorrow.                            Written discharge instructions were provided to the                            patient.                           -  High fiber diet.                           - Use FiberCon 1-2 tablets PO daily.                           - Augmentin 875 mg twice daily for 3 days. This is                            to decrease post interventional infectious risk of                            appendicitis since we worked in the region of the                            appendiceal orifice and she continues to have her                            appendix.                           - Minimize NSAIDs as able for at least 1 week. This                            will decrease post interventional bleeding risk.                           - Continue present medications.                           - Await pathology results.                           - Follow-up likely within the next 6 to 12 months                            pending final pathology.                           - If patient develops significant pain discomfort                            in the right lower quadrant with associated nausea                            and vomiting while on antibiotic  therapy, she                            should reach out to our office but we will likely                            have the patient come into the hospital for  cross-sectional imaging to rule out appendicitis.                           - The findings and recommendations were discussed                            with the patient.                           - The findings and recommendations were discussed                            with the patient's family. Procedure Code(s):        --- Professional ---                           [id number], Colonoscopy, flexible; with endoscopic                            mucosal resection                           45385, 59, Colonoscopy, flexible; with removal of                            tumor(s), polyp(s), or other lesion(s) by snare                            technique Diagnosis Code(s):        --- Professional ---                           K64.1, Second degree hemorrhoids                           D49.0, Neoplasm of unspecified behavior of                            digestive system                           D12.8, Benign neoplasm of rectum                           K64.4, Residual hemorrhoidal skin tags                           K63.5, Polyp of colon CPT copyright 2022 American Medical Association. All rights reserved. The codes documented in this report are preliminary and upon coder review may  be revised to meet current compliance requirements. Corliss Parish, MD 12/19/2022 11:43:30 AM Number of Addenda: 0

## 2022-12-19 NOTE — Anesthesia Postprocedure Evaluation (Signed)
Anesthesia Post Note  Patient: Judith Simmons  Procedure(s) Performed: COLONOSCOPY WITH PROPOFOL ENDOSCOPIC MUCOSAL RESECTION POLYPECTOMY HEMOSTASIS CONTROL     Patient location during evaluation: Endoscopy Anesthesia Type: MAC Level of consciousness: awake Pain management: pain level controlled Vital Signs Assessment: post-procedure vital signs reviewed and stable Respiratory status: spontaneous breathing, nonlabored ventilation and respiratory function stable Cardiovascular status: blood pressure returned to baseline and stable Postop Assessment: no apparent nausea or vomiting Anesthetic complications: no   No notable events documented.  Last Vitals:  Vitals:   12/19/22 1150 12/19/22 1155  BP: 126/74 117/67  Pulse: 70 79  Resp: 15 18  SpO2: 99% 99%    Last Pain:  Vitals:   12/19/22 1155  PainSc: 0-No pain                 Dinna Severs P Pragya Lofaso

## 2022-12-19 NOTE — Discharge Instructions (Signed)
YOU HAD AN ENDOSCOPIC PROCEDURE TODAY: Refer to the procedure report and other information in the discharge instructions given to you for any specific questions about what was found during the examination. If this information does not answer your questions, please call [facility] office at [phone] to clarify.  ° °YOU SHOULD EXPECT: Some feelings of bloating in the abdomen. Passage of more gas than usual. Walking can help get rid of the air that was put into your GI tract during the procedure and reduce the bloating. If you had a lower endoscopy (such as a colonoscopy or flexible sigmoidoscopy) you may notice spotting of blood in your stool or on the toilet paper. Some abdominal soreness may be present for a day or two, also. ° °DIET: Your first meal following the procedure should be a light meal and then it is ok to progress to your normal diet. A half-sandwich or bowl of soup is an example of a good first meal. Heavy or fried foods are harder to digest and may make you feel nauseous or bloated. Drink plenty of fluids but you should avoid alcoholic beverages for 24 hours. If you had a esophageal dilation, please see attached instructions for diet.   ° °ACTIVITY: Your care partner should take you home directly after the procedure. You should plan to take it easy, moving slowly for the rest of the day. You can resume normal activity the day after the procedure however YOU SHOULD NOT DRIVE, use power tools, machinery or perform tasks that involve climbing or major physical exertion for 24 hours (because of the sedation medicines used during the test).  ° °SYMPTOMS TO REPORT IMMEDIATELY: °A gastroenterologist can be reached at any hour. Please call [phone]  for any of the following symptoms:  °Following lower endoscopy (colonoscopy, flexible sigmoidoscopy) °Excessive amounts of blood in the stool  °Significant tenderness, worsening of abdominal pains  °Swelling of the abdomen that is new, acute  °Fever of 100° or  higher  °Following upper endoscopy (EGD, EUS, ERCP, esophageal dilation) °Vomiting of blood or coffee ground material  °New, significant abdominal pain  °New, significant chest pain or pain under the shoulder blades  °Painful or persistently difficult swallowing  °New shortness of breath  °Black, tarry-looking or red, bloody stools ° °FOLLOW UP:  °If any biopsies were taken you will be contacted by phone or by letter within the next 1-3 weeks. Call [phone]  if you have not heard about the biopsies in 3 weeks.  °Please also call with any specific questions about appointments or follow up tests. ° °

## 2022-12-20 LAB — SURGICAL PATHOLOGY

## 2022-12-21 ENCOUNTER — Encounter: Payer: Self-pay | Admitting: Gastroenterology

## 2022-12-23 ENCOUNTER — Encounter (HOSPITAL_COMMUNITY): Payer: Self-pay | Admitting: Gastroenterology

## 2023-07-07 ENCOUNTER — Other Ambulatory Visit (HOSPITAL_COMMUNITY): Payer: Self-pay

## 2023-07-07 MED ORDER — AMPHETAMINE-DEXTROAMPHET ER 15 MG PO CP24
15.0000 mg | ORAL_CAPSULE | Freq: Every morning | ORAL | 0 refills | Status: DC
  Filled 2023-07-07: qty 30, 30d supply, fill #0

## 2023-07-08 ENCOUNTER — Other Ambulatory Visit (HOSPITAL_COMMUNITY): Payer: Self-pay

## 2023-08-06 ENCOUNTER — Other Ambulatory Visit (HOSPITAL_COMMUNITY): Payer: Self-pay

## 2023-08-06 MED ORDER — AMPHETAMINE-DEXTROAMPHET ER 15 MG PO CP24
15.0000 mg | ORAL_CAPSULE | Freq: Every morning | ORAL | 0 refills | Status: AC
  Filled 2023-08-07: qty 10, 10d supply, fill #0
  Filled 2023-08-07: qty 20, 20d supply, fill #0

## 2023-08-06 MED ORDER — AMPHETAMINE-DEXTROAMPHET ER 15 MG PO CP24: 15.0000 mg | ORAL_CAPSULE | Freq: Every morning | ORAL | 0 refills | Status: AC

## 2023-08-07 ENCOUNTER — Other Ambulatory Visit (HOSPITAL_COMMUNITY): Payer: Self-pay

## 2024-01-08 ENCOUNTER — Encounter (INDEPENDENT_AMBULATORY_CARE_PROVIDER_SITE_OTHER): Payer: Self-pay

## 2024-08-09 ENCOUNTER — Encounter: Payer: Self-pay | Admitting: *Deleted
